# Patient Record
Sex: Female | Born: 1980 | Race: Black or African American | Hispanic: No | Marital: Married | Smoking: Never smoker
Health system: Southern US, Community
[De-identification: ages and names within clinical notes are randomized; demographics above are authoritative.]

## PROBLEM LIST (undated history)

## (undated) DIAGNOSIS — O24419 Gestational diabetes mellitus in pregnancy, unspecified control: Secondary | ICD-10-CM

## (undated) DIAGNOSIS — R87629 Unspecified abnormal cytological findings in specimens from vagina: Secondary | ICD-10-CM

## (undated) DIAGNOSIS — Z8619 Personal history of other infectious and parasitic diseases: Secondary | ICD-10-CM

## (undated) DIAGNOSIS — K602 Anal fissure, unspecified: Secondary | ICD-10-CM

## (undated) DIAGNOSIS — O139 Gestational [pregnancy-induced] hypertension without significant proteinuria, unspecified trimester: Secondary | ICD-10-CM

## (undated) DIAGNOSIS — E559 Vitamin D deficiency, unspecified: Secondary | ICD-10-CM

## (undated) HISTORY — DX: Gestational (pregnancy-induced) hypertension without significant proteinuria, unspecified trimester: O13.9

## (undated) HISTORY — DX: Vitamin D deficiency, unspecified: E55.9

## (undated) HISTORY — DX: Personal history of other infectious and parasitic diseases: Z86.19

## (undated) HISTORY — DX: Anal fissure, unspecified: K60.2

## (undated) HISTORY — DX: Unspecified abnormal cytological findings in specimens from vagina: R87.629

## (undated) HISTORY — PX: NO PAST SURGERIES: SHX2092

## (undated) HISTORY — DX: Gestational diabetes mellitus in pregnancy, unspecified control: O24.419

---

## 2003-01-26 ENCOUNTER — Encounter: Admission: RE | Admit: 2003-01-26 | Discharge: 2003-01-26 | Payer: Self-pay

## 2003-10-16 ENCOUNTER — Emergency Department (HOSPITAL_COMMUNITY): Admission: EM | Admit: 2003-10-16 | Discharge: 2003-10-16 | Payer: Self-pay

## 2005-10-13 ENCOUNTER — Emergency Department (HOSPITAL_COMMUNITY): Admission: EM | Admit: 2005-10-13 | Discharge: 2005-10-13 | Payer: Self-pay | Admitting: Emergency Medicine

## 2014-01-12 LAB — OB RESULTS CONSOLE RPR: RPR: NONREACTIVE

## 2014-01-12 LAB — OB RESULTS CONSOLE GC/CHLAMYDIA
CHLAMYDIA, DNA PROBE: NEGATIVE
Gonorrhea: NEGATIVE

## 2014-01-12 LAB — OB RESULTS CONSOLE ANTIBODY SCREEN: Antibody Screen: NEGATIVE

## 2014-01-12 LAB — OB RESULTS CONSOLE HEPATITIS B SURFACE ANTIGEN: Hepatitis B Surface Ag: NEGATIVE

## 2014-01-12 LAB — OB RESULTS CONSOLE ABO/RH: RH TYPE: POSITIVE

## 2014-01-12 LAB — OB RESULTS CONSOLE HIV ANTIBODY (ROUTINE TESTING): HIV: NONREACTIVE

## 2014-01-12 LAB — OB RESULTS CONSOLE RUBELLA ANTIBODY, IGM: RUBELLA: IMMUNE

## 2014-01-13 NOTE — L&D Delivery Note (Signed)
Operative Delivery Note At 3:01 AM a viable and healthy female was delivered via Vaginal, Vacuum [employer] banker, operational).  Presentation: vertex; Position: Left,, Occiput,, Posterior; Station: +3.  Verbal consent: obtained from patient.  Risks and benefits discussed in detail.  Risks include, but are not limited to the risks of anesthesia, bleeding, infection, damage to maternal tissues, fetal cephalhematoma.  There is also the risk of inability to effect vaginal delivery of the head, or shoulder dystocia that cannot be resolved by established maneuvers, leading to the need for emergency cesarean section. Deep variable decelerations( repetitive). Decision made to assist with delivery. Pedi called APGAR: 8, 9; weight pending  .   Placenta status: Intact, Pathology Spontaneous.   Cord: 3 vessels with the following complications: None.  Cord pH: none  Anesthesia: Local  Instruments: kiwi, mushroom Episiotomy: None Lacerations:  subcitoral laceration Suture Repair: 3.0 chromic Est. Blood Loss (mL): 537  Mom to postpartum.  Baby to Couplet care / Skin to Skin.  Kendra Sparks A 08/29/2014, 4:23 AM

## 2014-06-28 ENCOUNTER — Encounter: Payer: BC Managed Care – PPO | Attending: Obstetrics and Gynecology

## 2014-06-28 VITALS — Ht 61.0 in | Wt 162.8 lb

## 2014-06-28 DIAGNOSIS — Z713 Dietary counseling and surveillance: Secondary | ICD-10-CM | POA: Insufficient documentation

## 2014-06-28 DIAGNOSIS — O24410 Gestational diabetes mellitus in pregnancy, diet controlled: Secondary | ICD-10-CM | POA: Diagnosis not present

## 2014-07-04 NOTE — Progress Notes (Signed)
  Patient was seen on 06/28/14 for Gestational Diabetes self-management class at the [facility]. The following learning objectives were met by the patient during this course:   States the definition of Gestational Diabetes  States why dietary management is important in controlling blood glucose  Describes the effects each nutrient has on blood glucose levels  Demonstrates ability to create a balanced meal plan  Demonstrates carbohydrate counting   States when to check blood glucose levels  Demonstrates proper blood glucose monitoring techniques  States the effect of stress and exercise on blood glucose levels  States the importance of limiting caffeine and abstaining from alcohol and smoking  Blood glucose monitor given: NO, Patient has her own meter Blood glucose reading: 122 mg/dl after meal  Patient instructed to monitor glucose levels: FBS: 60 - <90 2 hour: <120  *Patient received handouts:  Nutrition Diabetes and Pregnancy  Carbohydrate Counting List  Patient will be seen for follow-up as needed.

## 2014-07-21 LAB — US OB FOLLOW UP

## 2014-07-24 ENCOUNTER — Other Ambulatory Visit (HOSPITAL_COMMUNITY): Payer: Self-pay | Admitting: Obstetrics and Gynecology

## 2014-07-24 DIAGNOSIS — O24912 Unspecified diabetes mellitus in pregnancy, second trimester: Secondary | ICD-10-CM

## 2014-07-24 DIAGNOSIS — IMO0002 Reserved for concepts with insufficient information to code with codable children: Secondary | ICD-10-CM

## 2014-07-24 DIAGNOSIS — R9389 Abnormal findings on diagnostic imaging of other specified body structures: Secondary | ICD-10-CM

## 2014-07-25 ENCOUNTER — Ambulatory Visit (HOSPITAL_COMMUNITY)
Admission: RE | Admit: 2014-07-25 | Discharge: 2014-07-25 | Disposition: A | Discharge status: Home / Self Care | Payer: BC Managed Care – PPO | Source: Ambulatory Visit | Attending: Obstetrics and Gynecology | Admitting: Obstetrics and Gynecology

## 2014-07-25 ENCOUNTER — Encounter (HOSPITAL_COMMUNITY): Payer: Self-pay

## 2014-07-25 DIAGNOSIS — O365930 Maternal care for other known or suspected poor fetal growth, third trimester, not applicable or unspecified: Secondary | ICD-10-CM | POA: Insufficient documentation

## 2014-07-25 DIAGNOSIS — Z3A33 33 weeks gestation of pregnancy: Secondary | ICD-10-CM | POA: Insufficient documentation

## 2014-07-25 DIAGNOSIS — O24912 Unspecified diabetes mellitus in pregnancy, second trimester: Secondary | ICD-10-CM | POA: Diagnosis not present

## 2014-07-25 DIAGNOSIS — O283 Abnormal ultrasonic finding on antenatal screening of mother: Secondary | ICD-10-CM | POA: Insufficient documentation

## 2014-07-25 DIAGNOSIS — IMO0002 Reserved for concepts with insufficient information to code with codable children: Secondary | ICD-10-CM

## 2014-07-25 DIAGNOSIS — O365920 Maternal care for other known or suspected poor fetal growth, second trimester, not applicable or unspecified: Secondary | ICD-10-CM | POA: Diagnosis not present

## 2014-07-25 DIAGNOSIS — Z3A00 Weeks of gestation of pregnancy not specified: Secondary | ICD-10-CM | POA: Insufficient documentation

## 2014-07-25 DIAGNOSIS — R9389 Abnormal findings on diagnostic imaging of other specified body structures: Secondary | ICD-10-CM

## 2014-07-25 DIAGNOSIS — Z3689 Encounter for other specified antenatal screening: Secondary | ICD-10-CM | POA: Insufficient documentation

## 2014-07-25 HISTORY — DX: Gestational diabetes mellitus in pregnancy, unspecified control: O24.419

## 2014-07-26 ENCOUNTER — Encounter (HOSPITAL_COMMUNITY): Payer: Self-pay | Admitting: Obstetrics and Gynecology

## 2014-07-26 ENCOUNTER — Other Ambulatory Visit (HOSPITAL_COMMUNITY): Payer: Self-pay | Admitting: Obstetrics and Gynecology

## 2014-08-07 LAB — OB RESULTS CONSOLE GBS: STREP GROUP B AG: POSITIVE

## 2014-08-25 ENCOUNTER — Telehealth (HOSPITAL_COMMUNITY): Payer: Self-pay | Admitting: *Deleted

## 2014-08-25 ENCOUNTER — Encounter (HOSPITAL_COMMUNITY): Payer: Self-pay | Admitting: *Deleted

## 2014-08-25 NOTE — Telephone Encounter (Signed)
Preadmission screen  

## 2014-08-28 ENCOUNTER — Inpatient Hospital Stay (HOSPITAL_COMMUNITY)
Admission: AD | Admit: 2014-08-28 | Discharge: 2014-08-31 | DRG: 774 | Disposition: A | Discharge status: Home / Self Care | Payer: BC Managed Care – PPO | Source: Ambulatory Visit | Attending: Obstetrics and Gynecology | Admitting: Obstetrics and Gynecology

## 2014-08-28 ENCOUNTER — Encounter (HOSPITAL_COMMUNITY): Payer: Self-pay | Admitting: *Deleted

## 2014-08-28 DIAGNOSIS — O24410 Gestational diabetes mellitus in pregnancy, diet controlled: Secondary | ICD-10-CM | POA: Diagnosis present

## 2014-08-28 DIAGNOSIS — O365930 Maternal care for other known or suspected poor fetal growth, third trimester, not applicable or unspecified: Principal | ICD-10-CM | POA: Diagnosis present

## 2014-08-28 DIAGNOSIS — Z833 Family history of diabetes mellitus: Secondary | ICD-10-CM | POA: Diagnosis not present

## 2014-08-28 DIAGNOSIS — O1424 HELLP syndrome, complicating childbirth: Secondary | ICD-10-CM | POA: Diagnosis present

## 2014-08-28 DIAGNOSIS — Z3A38 38 weeks gestation of pregnancy: Secondary | ICD-10-CM | POA: Diagnosis present

## 2014-08-28 DIAGNOSIS — O99824 Streptococcus B carrier state complicating childbirth: Secondary | ICD-10-CM | POA: Diagnosis present

## 2014-08-28 DIAGNOSIS — O1423 HELLP syndrome (HELLP), third trimester: Secondary | ICD-10-CM | POA: Diagnosis present

## 2014-08-28 LAB — URIC ACID: URIC ACID, SERUM: 7.7 mg/dL — AB (ref 2.3–6.6)

## 2014-08-28 LAB — CBC
HCT: 40.9 % (ref 36.0–46.0)
Hemoglobin: 13.9 g/dL (ref 12.0–15.0)
MCH: 30.7 pg (ref 26.0–34.0)
MCHC: 34 g/dL (ref 30.0–36.0)
MCV: 90.3 fL (ref 78.0–100.0)
PLATELETS: 89 10*3/uL — AB (ref 150–400)
RBC: 4.53 MIL/uL (ref 3.87–5.11)
RDW: 14 % (ref 11.5–15.5)
WBC: 9.9 10*3/uL (ref 4.0–10.5)

## 2014-08-28 LAB — APTT: APTT: 34 s (ref 24–37)

## 2014-08-28 LAB — COMPREHENSIVE METABOLIC PANEL
ALT: 471 U/L — ABNORMAL HIGH (ref 14–54)
AST: 198 U/L — AB (ref 15–41)
Albumin: 2.8 g/dL — ABNORMAL LOW (ref 3.5–5.0)
Alkaline Phosphatase: 306 U/L — ABNORMAL HIGH (ref 38–126)
Anion gap: 8 (ref 5–15)
BUN: 8 mg/dL (ref 6–20)
CHLORIDE: 109 mmol/L (ref 101–111)
CO2: 20 mmol/L — AB (ref 22–32)
Calcium: 10.6 mg/dL — ABNORMAL HIGH (ref 8.9–10.3)
Creatinine, Ser: 1.34 mg/dL — ABNORMAL HIGH (ref 0.44–1.00)
GFR, EST NON AFRICAN AMERICAN: 52 mL/min — AB (ref >60)
Glucose, Bld: 99 mg/dL (ref 65–99)
POTASSIUM: 3.9 mmol/L (ref 3.5–5.1)
SODIUM: 137 mmol/L (ref 135–145)
Total Bilirubin: 0.9 mg/dL (ref 0.3–1.2)
Total Protein: 6.2 g/dL — ABNORMAL LOW (ref 6.5–8.1)

## 2014-08-28 LAB — TYPE AND SCREEN
ABO/RH(D): O POS
ANTIBODY SCREEN: NEGATIVE

## 2014-08-28 LAB — FIBRINOGEN: FIBRINOGEN: 338 mg/dL (ref 204–475)

## 2014-08-28 LAB — PROTIME-INR
INR: 1.14 (ref 0.00–1.49)
PROTHROMBIN TIME: 14.8 s (ref 11.6–15.2)

## 2014-08-28 LAB — SAVE SMEAR

## 2014-08-28 MED ORDER — MAGNESIUM SULFATE BOLUS VIA INFUSION
4.0000 g | Freq: Once | INTRAVENOUS | Status: AC
  Administered 2014-08-28: 4 g via INTRAVENOUS
  Filled 2014-08-28: qty 500

## 2014-08-28 MED ORDER — OXYTOCIN 10 UNIT/ML IJ SOLN: 10.0000 [IU] | Freq: Once | INTRAMUSCULAR | Status: DC

## 2014-08-28 MED ORDER — PENICILLIN G POTASSIUM 5000000 UNITS IJ SOLR
2.5000 10*6.[IU] | INTRAVENOUS | Status: DC
  Filled 2014-08-28 (×2): qty 2.5

## 2014-08-28 MED ORDER — SODIUM CHLORIDE 0.9 % IJ SOLN: 9.0000 mL | INTRAMUSCULAR | Status: DC | PRN

## 2014-08-28 MED ORDER — OXYTOCIN BOLUS FROM INFUSION
500.0000 mL | INTRAVENOUS | Status: DC
  Administered 2014-08-29: 500 mL via INTRAVENOUS

## 2014-08-28 MED ORDER — PENICILLIN G POTASSIUM 5000000 UNITS IJ SOLR
5.0000 10*6.[IU] | Freq: Once | INTRAVENOUS | Status: DC
  Filled 2014-08-28: qty 5

## 2014-08-28 MED ORDER — LACTATED RINGERS IV SOLN
INTRAVENOUS | Status: DC
  Administered 2014-08-28: 20:00:00 via INTRAVENOUS

## 2014-08-28 MED ORDER — FENTANYL 2.5 MCG/ML BUPIVACAINE 1/10 % EPIDURAL INFUSION (WH - ANES): 14.0000 mL/h | INTRAMUSCULAR | Status: DC | PRN

## 2014-08-28 MED ORDER — ACETAMINOPHEN 325 MG PO TABS: 650.0000 mg | ORAL_TABLET | ORAL | Status: DC | PRN

## 2014-08-28 MED ORDER — LACTATED RINGERS IV SOLN: 500.0000 mL | INTRAVENOUS | Status: DC | PRN

## 2014-08-28 MED ORDER — OXYCODONE-ACETAMINOPHEN 5-325 MG PO TABS: 2.0000 | ORAL_TABLET | ORAL | Status: DC | PRN

## 2014-08-28 MED ORDER — ONDANSETRON HCL 4 MG/2ML IJ SOLN
4.0000 mg | Freq: Four times a day (QID) | INTRAMUSCULAR | Status: DC | PRN
  Filled 2014-08-28: qty 2

## 2014-08-28 MED ORDER — EPHEDRINE 5 MG/ML INJ
10.0000 mg | INTRAVENOUS | Status: DC | PRN
  Filled 2014-08-28: qty 2

## 2014-08-28 MED ORDER — FLEET ENEMA 7-19 GM/118ML RE ENEM: 1.0000 | ENEMA | RECTAL | Status: DC | PRN

## 2014-08-28 MED ORDER — OXYTOCIN 40 UNITS IN LACTATED RINGERS INFUSION - SIMPLE MED: 62.5000 mL/h | INTRAVENOUS | Status: DC

## 2014-08-28 MED ORDER — PHENYLEPHRINE 40 MCG/ML (10ML) SYRINGE FOR IV PUSH (FOR BLOOD PRESSURE SUPPORT)
80.0000 ug | PREFILLED_SYRINGE | INTRAVENOUS | Status: DC | PRN
  Filled 2014-08-28: qty 2

## 2014-08-28 MED ORDER — PENICILLIN G POTASSIUM 5000000 UNITS IJ SOLR
5.0000 10*6.[IU] | Freq: Once | INTRAVENOUS | Status: AC
  Administered 2014-08-28: 5 10*6.[IU] via INTRAVENOUS
  Filled 2014-08-28: qty 5

## 2014-08-28 MED ORDER — OXYCODONE-ACETAMINOPHEN 5-325 MG PO TABS: 1.0000 | ORAL_TABLET | ORAL | Status: DC | PRN

## 2014-08-28 MED ORDER — OXYTOCIN 40 UNITS IN LACTATED RINGERS INFUSION - SIMPLE MED
1.0000 m[IU]/min | INTRAVENOUS | Status: DC
  Administered 2014-08-28: 2 m[IU]/min via INTRAVENOUS
  Administered 2014-08-29: 6 m[IU]/min via INTRAVENOUS
  Filled 2014-08-28: qty 1000

## 2014-08-28 MED ORDER — LIDOCAINE HCL (PF) 1 % IJ SOLN: 30.0000 mL | INTRAMUSCULAR | Status: DC | PRN

## 2014-08-28 MED ORDER — PHENYLEPHRINE 40 MCG/ML (10ML) SYRINGE FOR IV PUSH (FOR BLOOD PRESSURE SUPPORT)
PREFILLED_SYRINGE | INTRAVENOUS | Status: AC
  Filled 2014-08-28: qty 20

## 2014-08-28 MED ORDER — OXYCODONE-ACETAMINOPHEN 5-325 MG PO TABS
2.0000 | ORAL_TABLET | ORAL | Status: DC | PRN
Start: 2014-08-28 — End: 2014-08-28

## 2014-08-28 MED ORDER — LACTATED RINGERS IV SOLN
INTRAVENOUS | Status: DC
  Administered 2014-08-28 (×2): via INTRAVENOUS

## 2014-08-28 MED ORDER — MAGNESIUM SULFATE 50 % IJ SOLN
1.0000 g/h | INTRAMUSCULAR | Status: DC
  Administered 2014-08-28: 2 g/h via INTRAVENOUS
  Administered 2014-08-30: 1 g/h via INTRAVENOUS
  Filled 2014-08-28 (×2): qty 80

## 2014-08-28 MED ORDER — DIPHENHYDRAMINE HCL 12.5 MG/5ML PO ELIX
12.5000 mg | ORAL_SOLUTION | Freq: Four times a day (QID) | ORAL | Status: DC | PRN
  Filled 2014-08-28: qty 5

## 2014-08-28 MED ORDER — ONDANSETRON HCL 4 MG/2ML IJ SOLN
4.0000 mg | Freq: Four times a day (QID) | INTRAMUSCULAR | Status: DC | PRN
  Administered 2014-08-29: 4 mg via INTRAVENOUS

## 2014-08-28 MED ORDER — DIPHENHYDRAMINE HCL 50 MG/ML IJ SOLN: 12.5000 mg | INTRAMUSCULAR | Status: DC | PRN

## 2014-08-28 MED ORDER — OXYTOCIN BOLUS FROM INFUSION
500.0000 mL | INTRAVENOUS | Status: DC
Start: 2014-08-28 — End: 2014-08-28

## 2014-08-28 MED ORDER — CITRIC ACID-SODIUM CITRATE 334-500 MG/5ML PO SOLN: 30.0000 mL | ORAL | Status: DC | PRN

## 2014-08-28 MED ORDER — LIDOCAINE HCL (PF) 1 % IJ SOLN
30.0000 mL | INTRAMUSCULAR | Status: AC | PRN
  Administered 2014-08-29: 30 mL via SUBCUTANEOUS
  Filled 2014-08-28: qty 30

## 2014-08-28 MED ORDER — FENTANYL 2.5 MCG/ML BUPIVACAINE 1/10 % EPIDURAL INFUSION (WH - ANES)
INTRAMUSCULAR | Status: AC
  Filled 2014-08-28: qty 125

## 2014-08-28 MED ORDER — PENICILLIN G POTASSIUM 5000000 UNITS IJ SOLR
2.5000 10*6.[IU] | INTRAVENOUS | Status: DC
  Administered 2014-08-28: 2.5 10*6.[IU] via INTRAVENOUS
  Filled 2014-08-28 (×5): qty 2.5

## 2014-08-28 MED ORDER — CITRIC ACID-SODIUM CITRATE 334-500 MG/5ML PO SOLN
30.0000 mL | ORAL | Status: DC | PRN
  Filled 2014-08-28: qty 30

## 2014-08-28 MED ORDER — DIPHENHYDRAMINE HCL 50 MG/ML IJ SOLN: 12.5000 mg | Freq: Four times a day (QID) | INTRAMUSCULAR | Status: DC | PRN

## 2014-08-28 MED ORDER — FENTANYL 10 MCG/ML IV SOLN
INTRAVENOUS | Status: DC
  Administered 2014-08-28 – 2014-08-29 (×2): via INTRAVENOUS
  Filled 2014-08-28 (×2): qty 50

## 2014-08-28 MED ORDER — NALOXONE HCL 0.4 MG/ML IJ SOLN
0.4000 mg | INTRAMUSCULAR | Status: DC | PRN
  Filled 2014-08-28: qty 1

## 2014-08-28 MED ORDER — ONDANSETRON HCL 4 MG/2ML IJ SOLN: 4.0000 mg | Freq: Four times a day (QID) | INTRAMUSCULAR | Status: DC | PRN

## 2014-08-28 NOTE — Progress Notes (Signed)
Called regarding FHR deceleration down to 90"s  for 7 mins  With good variability from  baseline 140. Ctx q 2-3 mins subsequent tracing showed return to baseline with accels to 160 VE: 4/90/-2 unchanged from RN exam during the deceleration Ctx now  q 2- 4 mins Received one dose of PCN plt 89K ( nl plt during preg)  IMP: thrombocytopenia r/o HELLP, DIC, gestational GBS cx (+) on IV PCN Early labor ClassA1 GDM P) await labs( added CMP, uric acid, DIC panel). AROM after 2nd dose of IV PCN. Pitocin augmentation prn Fentanyl PCA for pain mgmt. BS q 4 hrs

## 2014-08-28 NOTE — MAU Note (Signed)
Pt states that she began to have regular contractions this afternoon at 1500. Pt denies LOF and states that she is having some spotting with wiping when in BR and baby is active.

## 2014-08-28 NOTE — MAU Note (Signed)
Admit patient with routine orders and GBS protocol orders.

## 2014-08-28 NOTE — H&P (Signed)
Kendra Sparks is a 34 y.o. female presenting @ 38 4/7 weeks  In active labor. Intact membrane. GBS cx (+) (+) vaginal bleeding noted today  Maternal Medical History:  Reason for admission: Contractions and vaginal bleeding.   Contractions: Onset was 6-12 hours ago.   Frequency: regular.   Perceived severity is moderate.    Fetal activity: Perceived fetal activity is normal.    Prenatal complications: SGA Class A1 GDM  Prenatal Complications - Diabetes: gestational. Diabetes is managed by diet.      OB History    Gravida Para Term Preterm AB TAB SAB Ectopic Multiple Living   1              Past Medical History  Diagnosis Date  . GDM (gestational diabetes mellitus)   . Vaginal Pap smear, abnormal   . Hx of condyloma acuminatum   . Vitamin D deficiency   . Hx of varicella   . Anal fissure   . Gestational diabetes     diet controlled   Past Surgical History  Procedure Laterality Date  . No past surgeries     Family History: family history includes Cancer in her mother; Diabetes in her maternal grandmother; Heart attack in her maternal grandfather; Hypertension in her maternal grandmother and mother. Social History:  reports that she has never smoked. She has never used smokeless tobacco. She reports that she does not drink alcohol or use illicit drugs.   Prenatal Transfer Tool  Maternal Diabetes: Yes:  Diabetes Type:  Diet controlled Genetic Screening: Normal Maternal Ultrasounds/Referrals: Abnormal:  Findings:   Other:SGA Fetal Ultrasounds or other Referrals:  Referred to Materal Fetal Medicine  Maternal Substance Abuse:  No Significant Maternal Medications:  Meds include: Other:  Significant Maternal Lab Results:  Lab values include: Group B Strep positive Other Comments:  seen by MFM thought constitutionally small  Review of Systems  All other systems reviewed and are negative.    Exam Physical Exam  Constitutional: She is oriented to person, place,  and time. She appears well-developed and well-nourished.  Eyes: EOM are normal.  Neck: Neck supple.  Cardiovascular: Regular rhythm.   Respiratory: Effort normal.  GI: Soft.  Musculoskeletal: She exhibits no edema.  Neurological: She is alert and oriented to person, place, and time.  Skin: Skin is warm and dry.  Psychiatric: She has a normal mood and affect.   VE per RN 3/90/-2 heavy bloody show  Prenatal labs: ABO, Rh: O/Positive/-- (12/31 0000) Antibody: Negative (12/31 0000) Rubella: Immune (12/31 0000) RPR: Nonreactive (12/31 0000)  HBsAg: Negative (12/31 0000)  HIV: Non-reactive (12/31 0000)  GBS: Positive (07/25 0000)   Assessment/Plan: Early labor Class a1 GDM GBS cx (+) P) admit  Routine labs. IV PCN. Pitocin augmentation. AROm prn. BS q 4 hrs  Morse Brueggemann A

## 2014-08-28 NOTE — Progress Notes (Signed)
Labs c/w HELLP syndrome CMP Latest Ref Rng 08/28/2014  Glucose 65 - 99 mg/dL 99  BUN 6 - 20 mg/dL 8  Creatinine 2.95 - 6.21 mg/dL 3.08(M)  Sodium 578 - 469 mmol/L 137  Potassium 3.5 - 5.1 mmol/L 3.9  Chloride 101 - 111 mmol/L 109  CO2 22 - 32 mmol/L 20(L)  Calcium 8.9 - 10.3 mg/dL 10.6(H)  Total Protein 6.5 - 8.1 g/dL 6.2(L)  Total Bilirubin 0.3 - 1.2 mg/dL 0.9  Alkaline Phos 38 - 126 U/L 306(H)  AST 15 - 41 U/L 198(H)  ALT 14 - 54 U/L 471(H)   BP 134/57 DIC panel: fibrinogen 338. PT 14  P) start magnesium for seizure prophylaxis. Start Pitocin now. Amniotomy as planned. Pt and husband informed

## 2014-08-29 ENCOUNTER — Encounter (HOSPITAL_COMMUNITY): Payer: Self-pay | Admitting: *Deleted

## 2014-08-29 DIAGNOSIS — O24410 Gestational diabetes mellitus in pregnancy, diet controlled: Secondary | ICD-10-CM | POA: Diagnosis present

## 2014-08-29 DIAGNOSIS — O1424 HELLP syndrome, complicating childbirth: Secondary | ICD-10-CM | POA: Diagnosis present

## 2014-08-29 LAB — MAGNESIUM
MAGNESIUM: 5 mg/dL — AB (ref 1.7–2.4)
Magnesium: 5.4 mg/dL — ABNORMAL HIGH (ref 1.7–2.4)

## 2014-08-29 LAB — COMPREHENSIVE METABOLIC PANEL
ALBUMIN: 2.2 g/dL — AB (ref 3.5–5.0)
ALK PHOS: 233 U/L — AB (ref 38–126)
ALT: 416 U/L — ABNORMAL HIGH (ref 14–54)
ALT: 302 U/L — AB (ref 14–54)
ANION GAP: 14 (ref 5–15)
AST: 170 U/L — ABNORMAL HIGH (ref 15–41)
AST: 111 U/L — AB (ref 15–41)
Albumin: 2.4 g/dL — ABNORMAL LOW (ref 3.5–5.0)
Alkaline Phosphatase: 308 U/L — ABNORMAL HIGH (ref 38–126)
Anion gap: 9 (ref 5–15)
BILIRUBIN TOTAL: 0.9 mg/dL (ref 0.3–1.2)
BILIRUBIN TOTAL: 0.9 mg/dL (ref 0.3–1.2)
BUN: 9 mg/dL (ref 6–20)
BUN: 10 mg/dL (ref 6–20)
CO2: 15 mmol/L — ABNORMAL LOW (ref 22–32)
CO2: 20 mmol/L — ABNORMAL LOW (ref 22–32)
CREATININE: 1.31 mg/dL — AB (ref 0.44–1.00)
Calcium: 10.2 mg/dL (ref 8.9–10.3)
Calcium: 9.1 mg/dL (ref 8.9–10.3)
Chloride: 106 mmol/L (ref 101–111)
Chloride: 102 mmol/L (ref 101–111)
Creatinine, Ser: 1.45 mg/dL — ABNORMAL HIGH (ref 0.44–1.00)
GFR calc Af Amer: >60 mL/min (ref >60)
GFR, EST AFRICAN AMERICAN: 55 mL/min — AB (ref >60)
GFR, EST NON AFRICAN AMERICAN: 47 mL/min — AB (ref >60)
GFR, EST NON AFRICAN AMERICAN: 54 mL/min — AB (ref >60)
GLUCOSE: 110 mg/dL — AB (ref 65–99)
Glucose, Bld: 101 mg/dL — ABNORMAL HIGH (ref 65–99)
POTASSIUM: 4.6 mmol/L (ref 3.5–5.1)
POTASSIUM: 3.9 mmol/L (ref 3.5–5.1)
Sodium: 135 mmol/L (ref 135–145)
Sodium: 131 mmol/L — ABNORMAL LOW (ref 135–145)
TOTAL PROTEIN: 5.5 g/dL — AB (ref 6.5–8.1)
TOTAL PROTEIN: 5 g/dL — AB (ref 6.5–8.1)

## 2014-08-29 LAB — CBC
HEMATOCRIT: 36.3 % (ref 36.0–46.0)
HEMATOCRIT: 28.5 % — AB (ref 36.0–46.0)
HEMOGLOBIN: 9.8 g/dL — AB (ref 12.0–15.0)
Hemoglobin: 12.5 g/dL (ref 12.0–15.0)
MCH: 30.8 pg (ref 26.0–34.0)
MCH: 30.4 pg (ref 26.0–34.0)
MCHC: 34.4 g/dL (ref 30.0–36.0)
MCHC: 34.4 g/dL (ref 30.0–36.0)
MCV: 89.4 fL (ref 78.0–100.0)
MCV: 88.5 fL (ref 78.0–100.0)
Platelets: 102 10*3/uL — ABNORMAL LOW (ref 150–400)
Platelets: 104 10*3/uL — ABNORMAL LOW (ref 150–400)
RBC: 4.06 MIL/uL (ref 3.87–5.11)
RBC: 3.22 MIL/uL — AB (ref 3.87–5.11)
RDW: 13.9 % (ref 11.5–15.5)
RDW: 13.9 % (ref 11.5–15.5)
WBC: 14.4 10*3/uL — ABNORMAL HIGH (ref 4.0–10.5)
WBC: 22.1 10*3/uL — AB (ref 4.0–10.5)

## 2014-08-29 LAB — RPR: RPR Ser Ql: NONREACTIVE

## 2014-08-29 LAB — GLUCOSE, CAPILLARY
GLUCOSE-CAPILLARY: 101 mg/dL — AB (ref 65–99)
Glucose-Capillary: 90 mg/dL (ref 65–99)

## 2014-08-29 LAB — URIC ACID: Uric Acid, Serum: 8.7 mg/dL — ABNORMAL HIGH (ref 2.3–6.6)

## 2014-08-29 LAB — MRSA PCR SCREENING: MRSA by PCR: NEGATIVE

## 2014-08-29 LAB — ABO/RH: ABO/RH(D): O POS

## 2014-08-29 MED ORDER — SENNOSIDES-DOCUSATE SODIUM 8.6-50 MG PO TABS
2.0000 | ORAL_TABLET | ORAL | Status: DC
  Administered 2014-08-29 – 2014-08-31 (×3): 2 via ORAL
  Filled 2014-08-29 (×4): qty 2

## 2014-08-29 MED ORDER — WITCH HAZEL-GLYCERIN EX PADS: 1.0000 "application " | MEDICATED_PAD | CUTANEOUS | Status: DC | PRN

## 2014-08-29 MED ORDER — IBUPROFEN 600 MG PO TABS
600.0000 mg | ORAL_TABLET | Freq: Four times a day (QID) | ORAL | Status: DC
  Filled 2014-08-29: qty 1

## 2014-08-29 MED ORDER — PRENATAL MULTIVITAMIN CH
1.0000 | ORAL_TABLET | Freq: Every day | ORAL | Status: DC
  Administered 2014-08-29 – 2014-08-31 (×3): 1 via ORAL
  Filled 2014-08-29 (×3): qty 1

## 2014-08-29 MED ORDER — DIPHENHYDRAMINE HCL 25 MG PO CAPS: 25.0000 mg | ORAL_CAPSULE | Freq: Four times a day (QID) | ORAL | Status: DC | PRN

## 2014-08-29 MED ORDER — OXYCODONE-ACETAMINOPHEN 5-325 MG PO TABS: 1.0000 | ORAL_TABLET | ORAL | Status: DC | PRN

## 2014-08-29 MED ORDER — FERROUS SULFATE 325 (65 FE) MG PO TABS
325.0000 mg | ORAL_TABLET | Freq: Two times a day (BID) | ORAL | Status: DC
  Administered 2014-08-29 – 2014-08-31 (×6): 325 mg via ORAL
  Filled 2014-08-29 (×6): qty 1

## 2014-08-29 MED ORDER — ACETAMINOPHEN 325 MG PO TABS
650.0000 mg | ORAL_TABLET | ORAL | Status: DC | PRN
  Administered 2014-08-30 (×2): 650 mg via ORAL
  Filled 2014-08-29 (×2): qty 2

## 2014-08-29 MED ORDER — ONDANSETRON HCL 4 MG/2ML IJ SOLN: 4.0000 mg | INTRAMUSCULAR | Status: DC | PRN

## 2014-08-29 MED ORDER — LACTATED RINGERS IV SOLN: INTRAVENOUS | Status: DC

## 2014-08-29 MED ORDER — BENZOCAINE-MENTHOL 20-0.5 % EX AERO
1.0000 "application " | INHALATION_SPRAY | CUTANEOUS | Status: DC | PRN
  Filled 2014-08-29: qty 56

## 2014-08-29 MED ORDER — DIBUCAINE 1 % RE OINT: 1.0000 "application " | TOPICAL_OINTMENT | RECTAL | Status: DC | PRN

## 2014-08-29 MED ORDER — LACTATED RINGERS IV SOLN
INTRAVENOUS | Status: DC
  Administered 2014-08-29: 18:00:00 via INTRAVENOUS

## 2014-08-29 MED ORDER — LANOLIN HYDROUS EX OINT: TOPICAL_OINTMENT | CUTANEOUS | Status: DC | PRN

## 2014-08-29 MED ORDER — ZOLPIDEM TARTRATE 5 MG PO TABS: 5.0000 mg | ORAL_TABLET | Freq: Every evening | ORAL | Status: DC | PRN

## 2014-08-29 MED ORDER — LACTATED RINGERS IV BOLUS (SEPSIS)
1000.0000 mL | Freq: Once | INTRAVENOUS | Status: AC
  Administered 2014-08-29: 1000 mL via INTRAVENOUS

## 2014-08-29 MED ORDER — ONDANSETRON HCL 4 MG PO TABS: 4.0000 mg | ORAL_TABLET | ORAL | Status: DC | PRN

## 2014-08-29 MED ORDER — PANTOPRAZOLE SODIUM 40 MG PO TBEC
40.0000 mg | DELAYED_RELEASE_TABLET | Freq: Every day | ORAL | Status: DC
  Administered 2014-08-29 – 2014-08-31 (×3): 40 mg via ORAL
  Filled 2014-08-29 (×3): qty 1

## 2014-08-29 MED ORDER — OXYCODONE-ACETAMINOPHEN 5-325 MG PO TABS: 2.0000 | ORAL_TABLET | ORAL | Status: DC | PRN

## 2014-08-29 MED ORDER — SIMETHICONE 80 MG PO CHEW: 80.0000 mg | CHEWABLE_TABLET | ORAL | Status: DC | PRN

## 2014-08-29 NOTE — Progress Notes (Signed)
S: called for rapid dilation and poss delivery  O: Pitocin 8 miu VE fully (+) 2 station  Tracing reviewed: deep variable decels with ctx Baseline 115 good variabilty Ctx q 2 mins  ImP: complete with variable decels P) pushed for a few ctx( ineffective). To use peanut ball and labor vtx down. Cont with amnioinfusion

## 2014-08-29 NOTE — Progress Notes (Addendum)
Wasted 31ml in sink of fentanyl PCA with E.Diamonds Lippard, RN

## 2014-08-29 NOTE — Progress Notes (Signed)
Patient up to stand at side of bed.  Orthostatic VS (see flowsheet).  Patient's heart rate increased to 130s-140swhile standing; patient denies dizziness, shortness of breath, pain.  Patient returned to bed and heart rate decreased to low 100s and 90s.  Dr. Seymour Bars notified; no new orders at this time.

## 2014-08-29 NOTE — Consult Note (Addendum)
Neonatology Note:   Attendance at Delivery:    I was asked by Dr. Cherly Hensen to attend this vacuum-assisted vaginal delivery at term due to known IUGR, HELLP, and FHR decelerations. The mother is a G1P0 O pos, GBS pos with HELLP, vaginal bleeding, and diet-controlled GDM. She had been followed by MFM and IUGR felt to be constitutional, with very recent onset of HELLP. On Magnesium sulfate for about 4 hours, and on Pen G > 4 hours prior to delivery. ROM 3 hours before delivery, fluid with meconium. Infant vigorous with good spontaneous cry and tone. Needed only minimal bulb suctioning. Ap 8/9. Lungs clear to ausc in DR. Infant appears well over weight requirement for CN. To CN to care of Pediatrician.   Doretha Sou, MD

## 2014-08-29 NOTE — Progress Notes (Signed)
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

## 2014-08-29 NOTE — Lactation Note (Signed)
This note was copied from the chart of Kendra Sparks. Lactation Consultation Note  Initial visit made.  Breastfeeding consultation services and support information given to patient.  Baby is 15 hours old and has been to the breast frequently.  This is mom's first baby.  Basic teaching done and questions answered.  Baby showing feeding cues.  Assisted with latching baby to breast using side lying position.  Baby latched easily and deep and active suckling observed.  Encouraged to call for feeding assist or concerns.  Patient Name: Kendra Genevie Elman [id number] Date: 08/29/2014 Reason for consult: Initial assessment   Maternal Data    Feeding Feeding Type: Breast Fed Length of feed: 10 min  LATCH Score/Interventions Latch: Grasps breast easily, tongue down, lips flanged, rhythmical sucking. Intervention(s): Adjust position;Assist with latch;Breast massage;Breast compression  Audible Swallowing: A few with stimulation Intervention(s): Skin to skin;Hand expression;Alternate breast massage  Type of Nipple: Everted at rest and after stimulation  Comfort (Breast/Nipple): Soft / non-tender     Hold (Positioning): Assistance needed to correctly position infant at breast and maintain latch. Intervention(s): Breastfeeding basics reviewed;Support Pillows;Position options;Skin to skin  LATCH Score: 8  Lactation Tools Discussed/Used     Consult Status      Huston Foley 08/29/2014, 5:00 PM

## 2014-08-29 NOTE — Progress Notes (Signed)
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 Repeat CBC, CMP with mag level @ 3 am. IV PCN

## 2014-08-29 NOTE — Progress Notes (Signed)
UR chart review completed.  

## 2014-08-30 LAB — COMPREHENSIVE METABOLIC PANEL
ALK PHOS: 207 U/L — AB (ref 38–126)
ALT: 193 U/L — AB (ref 14–54)
ANION GAP: 5 (ref 5–15)
AST: 50 U/L — ABNORMAL HIGH (ref 15–41)
Albumin: 2.1 g/dL — ABNORMAL LOW (ref 3.5–5.0)
BUN: 10 mg/dL (ref 6–20)
CALCIUM: 7.7 mg/dL — AB (ref 8.9–10.3)
CO2: 25 mmol/L (ref 22–32)
CREATININE: 1.25 mg/dL — AB (ref 0.44–1.00)
Chloride: 107 mmol/L (ref 101–111)
GFR, EST NON AFRICAN AMERICAN: 57 mL/min — AB (ref >60)
Glucose, Bld: 93 mg/dL (ref 65–99)
Potassium: 3.7 mmol/L (ref 3.5–5.1)
Sodium: 137 mmol/L (ref 135–145)
Total Bilirubin: 0.7 mg/dL (ref 0.3–1.2)
Total Protein: 4.9 g/dL — ABNORMAL LOW (ref 6.5–8.1)

## 2014-08-30 LAB — CBC
HCT: 25.6 % — ABNORMAL LOW (ref 36.0–46.0)
Hemoglobin: 8.7 g/dL — ABNORMAL LOW (ref 12.0–15.0)
MCH: 30.6 pg (ref 26.0–34.0)
MCHC: 34 g/dL (ref 30.0–36.0)
MCV: 90.1 fL (ref 78.0–100.0)
PLATELETS: 101 10*3/uL — AB (ref 150–400)
RBC: 2.84 MIL/uL — AB (ref 3.87–5.11)
RDW: 14.3 % (ref 11.5–15.5)
WBC: 17.2 10*3/uL — ABNORMAL HIGH (ref 4.0–10.5)

## 2014-08-30 LAB — URIC ACID: URIC ACID, SERUM: 8.1 mg/dL — AB (ref 2.3–6.6)

## 2014-08-30 MED ORDER — SODIUM CHLORIDE 0.9 % IJ SOLN
3.0000 mL | Freq: Two times a day (BID) | INTRAMUSCULAR | Status: DC
  Administered 2014-08-30 (×2): 3 mL via INTRAVENOUS

## 2014-08-30 MED ORDER — SODIUM CHLORIDE 0.9 % IJ SOLN: 3.0000 mL | INTRAMUSCULAR | Status: DC | PRN

## 2014-08-30 MED ORDER — SODIUM CHLORIDE 0.9 % IV SOLN: 250.0000 mL | INTRAVENOUS | Status: DC | PRN

## 2014-08-30 MED ORDER — MAGNESIUM OXIDE 400 (241.3 MG) MG PO TABS
200.0000 mg | ORAL_TABLET | Freq: Every day | ORAL | Status: DC
  Administered 2014-08-31: 200 mg via ORAL
  Filled 2014-08-30 (×2): qty 0.5

## 2014-08-30 NOTE — Progress Notes (Signed)
Pt transferred ambulatory to [facility] Rm #[id number].

## 2014-08-30 NOTE — Progress Notes (Signed)
PPD # 1/AICU Day #1  Subjective: Pt reports feeling well/ Pain controlled with ice pks and dermaplast Tolerating po/ Foley in place No HA, visual disturbances, or epigastric pain Activity: up with assistance Bleeding is light Newborn info:  Information for the patient's newborn:  Jaquanna, Ballentine [[id number]]  female Feeding: breast   Objective:  VS:  Filed Vitals:   08/30/14 0839 08/30/14 0900 08/30/14 0935 08/30/14 1000  BP:  123/63  108/52  Pulse: 102     Temp:      TempSrc:      Resp:   18   Height:      Weight:      SpO2: 98%        I&O: Intake/Output      08/16 0701 - 08/17 0700 08/17 0701 - 08/18 0700   P.O. 1390 600   I.V. (mL/kg) 2867.5 (38.4) 353.5 (4.7)   IV Piggyback     Total Intake(mL/kg) 4257.5 (57) 953.5 (12.8)   Urine (mL/kg/hr) 6900 (3.8) 1650 (5.8)   Emesis/NG output     Blood     Total Output 6900 1650   Net -2642.5 -696.5           Recent Labs  08/29/14 1000 08/30/14 0955  WBC 22.1* 17.2*  HGB 9.8* 8.7*  HCT 28.5* 25.6*  PLT 104* 101*    Blood type: --/--/O POS, O POS (08/15 1950) Rubella: Immune (12/31 0000)    Physical Exam:  General: alert, cooperative and no distress CV: Regular rate and rhythm Resp: CTA bilaterally Abdomen: soft, nontender, normal bowel sounds Perineum: Well approximated, no significant erythema, edema, or drainage; healing well. Uterine Fundus: firm, below umbilicus, nontender Lochia: minimal GU: Foley to SD, clear yellow, abundant Ext: edema 1+ BLE, Homans sign is negative, no sign of DVT and DTRs 1+, no clonus   Assessment: PPD # 1/ G1P1001/ S/P VAVD, periclitoral laceration HELLP A1GDM, delivered ABL anemia  Plan: LE, Ct, and uric acid all trending down, platelets stable, diuresis excellent-continue strict I/O, BP stable, watch closely  Discontinue Mag Sulfate Transfer to MB unit after 4 hrs obs  Consult with Dr. Cherly Hensen on A/P, agrees.   Signed: Lawernce Pitts, MSN,  CNM 08/30/2014, 10:48 AM

## 2014-08-30 NOTE — Lactation Note (Signed)
This note was copied from the chart of Kendra Sparks. Lactation Consultation Note  Patient Name: Kendra Manjot Beumer [id number] Date: 08/30/2014 Reason for consult: Follow-up assessment  NICU baby 70 hours old. Mom reports that she is experiencing a pinching pain while nursing and her nipples are sore. Demonstrated hand expression with small amount of colostrum visible at left breast. Enc mom to use EBM on nipples for healing. Assisted mom to latch baby in football position to left breast. Baby latched deeply, suckling rhythmically with a few swallows noted. Demonstrated to parents how to flange lower lip and enc mom to re-latch baby if he slips to the tip of her nipple. Mom reports increased comfort with nursing and states that this is the most comfortable she has been since the baby was born. Baby's has a tight lingual frenum that caused a bowl shape to the tongue. Discussed with that if sore nipples continue after her milk comes in, she should discuss baby's tongue/frenum with pediatrician. Mom aware of OP/BFSG and LC phone line assistance after D/C. Enc mom to nurse with cues, and enc mom to supplement with formula later this afternoon if baby hungry and her breast milk supply not increasing. Also discussed possibility of NS if baby not able to maintain a deep latch. Mom had reported that baby kept slipping off the breast earlier. Discussed assessment and interventions with patient's RN, Okey Regal.  Maternal Data Has patient been taught Hand Expression?: Yes Does the patient have breastfeeding experience prior to this delivery?: No  Feeding Feeding Type: Breast Fed Length of feed:  (LC assessed first 15 minutes of BF.)  LATCH Score/Interventions Latch: Grasps breast easily, tongue down, lips flanged, rhythmical sucking. Intervention(s): Adjust position;Assist with latch;Breast compression  Audible Swallowing: A few with stimulation Intervention(s): Skin to skin;Hand expression  Type of  Nipple: Everted at rest and after stimulation  Comfort (Breast/Nipple): Filling, red/small blisters or bruises, mild/mod discomfort  Problem noted: Mild/Moderate discomfort  Hold (Positioning): Assistance needed to correctly position infant at breast and maintain latch. Intervention(s): Breastfeeding basics reviewed;Support Pillows;Position options;Skin to skin  LATCH Score: 7  Lactation Tools Discussed/Used     Consult Status Consult Status: Follow-up Date: 08/31/14 Follow-up type: In-patient    Geralynn Ochs 08/30/2014, 11:59 AM

## 2014-08-30 NOTE — Lactation Note (Signed)
This note was copied from the chart of Kendra Keamber Macfadden. Lactation Consultation Note Last void >24 hours and mom is just beginning to pump.  MBU RN reports good latch, but baby is still hungry.  Read previous LC note and suggested small supplements to starts and hand expression with pumping every 3 hours.    Patient Name: Kendra Sparks Date: 08/30/2014     Maternal Data    Feeding Feeding Type: Breast Fed Length of feed: 30 min  LATCH Score/Interventions Latch: Grasps breast easily, tongue down, lips flanged, rhythmical sucking. Intervention(s): Assist with latch;Adjust position  Audible Swallowing: Spontaneous and intermittent  Type of Nipple: Everted at rest and after stimulation  Comfort (Breast/Nipple): Soft / non-tender     Hold (Positioning): Assistance needed to correctly position infant at breast and maintain latch.  LATCH Score: 9  Lactation Tools Discussed/Used     Consult Status      Shoptaw, Arvella Merles 08/30/2014, 5:33 PM

## 2014-08-31 LAB — COMPREHENSIVE METABOLIC PANEL
ALBUMIN: 2.4 g/dL — AB (ref 3.5–5.0)
ALT: 174 U/L — ABNORMAL HIGH (ref 14–54)
AST: 66 U/L — AB (ref 15–41)
Alkaline Phosphatase: 209 U/L — ABNORMAL HIGH (ref 38–126)
Anion gap: 6 (ref 5–15)
BUN: 13 mg/dL (ref 6–20)
CHLORIDE: 107 mmol/L (ref 101–111)
CO2: 27 mmol/L (ref 22–32)
Calcium: 7.8 mg/dL — ABNORMAL LOW (ref 8.9–10.3)
Creatinine, Ser: 1.16 mg/dL — ABNORMAL HIGH (ref 0.44–1.00)
GFR calc Af Amer: >60 mL/min (ref >60)
GFR calc non Af Amer: >60 mL/min (ref >60)
GLUCOSE: 95 mg/dL (ref 65–99)
POTASSIUM: 3.6 mmol/L (ref 3.5–5.1)
SODIUM: 140 mmol/L (ref 135–145)
Total Bilirubin: 0.7 mg/dL (ref 0.3–1.2)
Total Protein: 5.4 g/dL — ABNORMAL LOW (ref 6.5–8.1)

## 2014-08-31 LAB — CBC
HCT: 28.5 % — ABNORMAL LOW (ref 36.0–46.0)
Hemoglobin: 9.6 g/dL — ABNORMAL LOW (ref 12.0–15.0)
MCH: 31.2 pg (ref 26.0–34.0)
MCHC: 33.7 g/dL (ref 30.0–36.0)
MCV: 92.5 fL (ref 78.0–100.0)
PLATELETS: 137 10*3/uL — AB (ref 150–400)
RBC: 3.08 MIL/uL — ABNORMAL LOW (ref 3.87–5.11)
RDW: 15 % (ref 11.5–15.5)
WBC: 16.5 10*3/uL — AB (ref 4.0–10.5)

## 2014-08-31 MED ORDER — FERROUS SULFATE 325 (65 FE) MG PO TABS: 325.0000 mg | ORAL_TABLET | Freq: Every day | ORAL | Status: DC

## 2014-08-31 MED ORDER — IBUPROFEN 600 MG PO TABS: 600.0000 mg | ORAL_TABLET | Freq: Three times a day (TID) | ORAL | Status: DC | PRN

## 2014-08-31 MED ORDER — MAGNESIUM OXIDE 400 (241.3 MG) MG PO TABS: 200.0000 mg | ORAL_TABLET | Freq: Every day | ORAL | Status: DC

## 2014-08-31 MED ORDER — HYDROCHLOROTHIAZIDE 12.5 MG PO CAPS: 12.5000 mg | ORAL_CAPSULE | Freq: Every day | ORAL | Status: DC

## 2014-08-31 NOTE — Progress Notes (Signed)
PPD 2 SVD/ HELLP  S:  Reports feeling well - no PIH symptoms / desires DC today this pm if possible             Tolerating po/ No nausea or vomiting             Bleeding is light             Up ad lib / ambulatory / voiding QS  Newborn breast feeding & supplementing / Circumcision done  O:               VS: BP 124/64 mmHg  Pulse 70  Temp(Src) 98 F (36.7 C) (Oral)  Resp 16  Ht  (1.549 m)  Wt 74.753 kg (164 lb 12.8 oz)  BMI 31.15 kg/m2  SpO2 97%  Breastfeeding? Unknown   LABS:              Recent Labs  08/30/14 0955 08/31/14 0756  WBC 17.2* 16.5*  HGB 8.7* 9.6*  PLT 101* 137*               Blood type: --/--/O POS, O POS (08/15 1950)  Rubella: Immune (12/31 0000)                     I&O: no I& O since transfer - will check outpt today             Physical Exam:             Alert and oriented X3  Lungs: Clear and unlabored  Heart: regular rate and rhythm / no mumurs  Abdomen: soft, non-tender, non-distended              Fundus: firm, non-tender, U-1  Perineum: no edema  Lochia: light  Extremities: 1+ edema, no calf pain or tenderness    A: PPD # 2 HELLP             Assess outpt today for adequate diuresis off magnesium  Doing well - stable status  P: Routine post partum orders  Consider DC this PM if outpt good  Marlinda Mike CNM, MSN, [facility] 08/31/2014, 10:12 AM

## 2014-08-31 NOTE — Lactation Note (Signed)
This note was copied from the chart of Kendra Sparks. Lactation Consultation Note  Mom reports that her nipples are sore and that baby is coming off of the breast hungry.  Glandular tissue is palpable.  Baby's oral cavity is tight and he initially chewed on a gloved finger but as it was advanced he started to suckle better.  Mom was shown how to position him using an asymmetrical hold. Chin tug and lip flanging was also demonstrated.  I recommended she BF first give formula if needed and post pump.  The preemie setting was explained and demonstrated to her,  Patient Name: Kendra Sparks [id number] Date: 08/31/2014 Reason for consult: Follow-up assessment   Maternal Data    Feeding Feeding Type: Breast Fed  LATCH Score/Interventions Latch: Repeated attempts needed to sustain latch, nipple held in mouth throughout feeding, stimulation needed to elicit sucking reflex.  Audible Swallowing: A few with stimulation  Type of Nipple: Everted at rest and after stimulation  Comfort (Breast/Nipple): Soft / non-tender     Hold (Positioning): Assistance needed to correctly position infant at breast and maintain latch.  LATCH Score: 7  Lactation Tools Discussed/Used     Consult Status Consult Status: Follow-up Date: 09/01/14 Follow-up type: In-patient    Soyla Dryer 08/31/2014, 12:06 PM

## 2014-08-31 NOTE — Discharge Summary (Signed)
Obstetric Discharge Summary  Reason for Admission: onset of labor Prenatal Procedures: none Intrapartum Procedures: spontaneous vaginal delivery Postpartum Procedures: Magnesium sulfate prophyalxis Complications-Operative and Postpartum: 2nd degree perineal laceration HEMOGLOBIN  Date Value Ref Range Status  08/31/2014 9.6* 12.0 - 15.0 g/dL Final   HCT  Date Value Ref Range Status  08/31/2014 28.5* 36.0 - 46.0 % Final    Physical Exam:  General: alert, cooperative and no distress Lochia: appropriate Uterine Fundus: firm Incision: healing well DVT Evaluation: No evidence of DVT seen on physical exam.  Discharge Diagnoses: Term Pregnancy-delivered, Preelampsia and ABL anemia  Discharge Information: Date: 08/31/2014 Activity: pelvic rest Diet: routine Medications: PNV, Ibuprofen and Percocet Condition: stable Instructions: refer to practice specific booklet Discharge to: home Follow-up Information    Follow up with Elmira Olkowski A, MD. Schedule an appointment as soon as possible for a visit in 4 days.   Specialty:  Obstetrics and Gynecology   Contact information:   [address] Kendra Sparks [place] [address] [phone]       Newborn Data: Live born female  Birth Weight: 6 lb 9.3 oz (2985 g) APGAR: 8, 9  Home with mother.  Kendra Sparks 08/31/2014, 5:22 PM

## 2014-09-02 ENCOUNTER — Inpatient Hospital Stay (HOSPITAL_COMMUNITY): Admission: RE | Admit: 2014-09-02 | Payer: BC Managed Care – PPO | Source: Ambulatory Visit

## 2016-10-17 ENCOUNTER — Ambulatory Visit (INDEPENDENT_AMBULATORY_CARE_PROVIDER_SITE_OTHER): Payer: BC Managed Care – PPO | Admitting: Family Medicine

## 2016-10-17 ENCOUNTER — Encounter: Payer: Self-pay | Admitting: Family Medicine

## 2016-10-17 VITALS — BP 100/80 | HR 70 | Temp 98.2°F | Ht 61.0 in | Wt 125.7 lb

## 2016-10-17 DIAGNOSIS — E559 Vitamin D deficiency, unspecified: Secondary | ICD-10-CM

## 2016-10-17 DIAGNOSIS — Z13220 Encounter for screening for lipoid disorders: Secondary | ICD-10-CM

## 2016-10-17 DIAGNOSIS — Z23 Encounter for immunization: Secondary | ICD-10-CM

## 2016-10-17 DIAGNOSIS — Z0000 Encounter for general adult medical examination without abnormal findings: Secondary | ICD-10-CM | POA: Diagnosis not present

## 2016-10-17 DIAGNOSIS — R5383 Other fatigue: Secondary | ICD-10-CM

## 2016-10-17 LAB — COMPREHENSIVE METABOLIC PANEL
ALBUMIN: 4.5 g/dL (ref 3.5–5.2)
ALK PHOS: 84 U/L (ref 39–117)
ALT: 8 U/L (ref 0–35)
AST: 12 U/L (ref 0–37)
BUN: 15 mg/dL (ref 6–23)
CALCIUM: 9.8 mg/dL (ref 8.4–10.5)
CHLORIDE: 104 meq/L (ref 96–112)
CO2: 28 mEq/L (ref 19–32)
Creatinine, Ser: 0.7 mg/dL (ref 0.40–1.20)
GFR: 123.33 mL/min (ref >60.00)
Glucose, Bld: 90 mg/dL (ref 70–99)
POTASSIUM: 5.5 meq/L — AB (ref 3.5–5.1)
SODIUM: 139 meq/L (ref 135–145)
TOTAL PROTEIN: 7.6 g/dL (ref 6.0–8.3)
Total Bilirubin: 0.9 mg/dL (ref 0.2–1.2)

## 2016-10-17 LAB — LIPID PANEL
CHOLESTEROL: 177 mg/dL (ref 0–200)
HDL: 75.3 mg/dL (ref >39.00)
LDL Cholesterol: 94 mg/dL (ref 0–99)
NonHDL: 101.92
TRIGLYCERIDES: 38 mg/dL (ref 0.0–149.0)
Total CHOL/HDL Ratio: 2
VLDL: 7.6 mg/dL (ref 0.0–40.0)

## 2016-10-17 LAB — VITAMIN D 25 HYDROXY (VIT D DEFICIENCY, FRACTURES): VITD: 19.01 ng/mL — ABNORMAL LOW (ref 30.00–100.00)

## 2016-10-17 LAB — TSH: TSH: 1.44 u[IU]/mL (ref 0.35–4.50)

## 2016-10-17 LAB — T4, FREE: Free T4: 0.88 ng/dL (ref 0.60–1.60)

## 2016-10-17 NOTE — Patient Instructions (Addendum)
Health Maintenance, Female Adopting a healthy lifestyle and getting preventive care can go a long way to promote health and wellness. Talk with your health care provider about what schedule of regular examinations is right for you. This is a good chance for you to check in with your provider about disease prevention and staying healthy. In between checkups, there are plenty of things you can do on your own. Experts have done a lot of research about which lifestyle changes and preventive measures are most likely to keep you healthy. Ask your health care provider for more information. Weight and diet Eat a healthy diet  Be sure to include plenty of vegetables, fruits, low-fat dairy products, and lean protein.  Do not eat a lot of foods high in solid fats, added sugars, or salt.  Get regular exercise. This is one of the most important things you can do for your health. ? Most adults should exercise for at least 150 minutes each week. The exercise should increase your heart rate and make you sweat (moderate-intensity exercise). ? Most adults should also do strengthening exercises at least twice a week. This is in addition to the moderate-intensity exercise.  Maintain a healthy weight  Body mass index (BMI) is a measurement that can be used to identify possible weight problems. It estimates body fat based on height and weight. Your health care provider can help determine your BMI and help you achieve or maintain a healthy weight.  For females 20 years of age and older: ? A BMI below 18.5 is considered underweight. ? A BMI of 18.5 to 24.9 is normal. ? A BMI of 25 to 29.9 is considered overweight. ? A BMI of 30 and above is considered obese.  Watch levels of cholesterol and blood lipids  You should start having your blood tested for lipids and cholesterol at 34 years of age, then have this test every 5 years.  You may need to have your cholesterol levels checked more often if: ? Your lipid or  cholesterol levels are high. ? You are older than 34 years of age. ? You are at high risk for heart disease.  Cancer screening Lung Cancer  Lung cancer screening is recommended for adults 55-80 years old who are at high risk for lung cancer because of a history of smoking.  A yearly low-dose CT scan of the lungs is recommended for people who: ? Currently smoke. ? Have quit within the past 15 years. ? Have at least a 30-pack-year history of smoking. A pack year is smoking an average of one pack of cigarettes a day for 1 year.  Yearly screening should continue until it has been 15 years since you quit.  Yearly screening should stop if you develop a health problem that would prevent you from having lung cancer treatment.  Breast Cancer  Practice breast self-awareness. This means understanding how your breasts normally appear and feel.  It also means doing regular breast self-exams. Let your health care provider know about any changes, no matter how small.  If you are in your 20s or 30s, you should have a clinical breast exam (CBE) by a health care provider every 1-3 years as part of a regular health exam.  If you are 40 or older, have a CBE every year. Also consider having a breast X-ray (mammogram) every year.  If you have a family history of breast cancer, talk to your health care provider about genetic screening.  If you are at high risk   for breast cancer, talk to your health care provider about having an MRI and a mammogram every year.  Breast cancer gene (BRCA) assessment is recommended for women who have family members with BRCA-related cancers. BRCA-related cancers include: ? Breast. ? Ovarian. ? Tubal. ? Peritoneal cancers.  Results of the assessment will determine the need for genetic counseling and BRCA1 and BRCA2 testing.  Cervical Cancer Your health care provider may recommend that you be screened regularly for cancer of the pelvic organs (ovaries, uterus, and  vagina). This screening involves a pelvic examination, including checking for microscopic changes to the surface of your cervix (Pap test). You may be encouraged to have this screening done every 3 years, beginning at age 22.  For women ages 56-65, health care providers may recommend pelvic exams and Pap testing every 3 years, or they may recommend the Pap and pelvic exam, combined with testing for human papilloma virus (HPV), every 5 years. Some types of HPV increase your risk of cervical cancer. Testing for HPV may also be done on women of any age with unclear Pap test results.  Other health care providers may not recommend any screening for nonpregnant women who are considered low risk for pelvic cancer and who do not have symptoms. Ask your health care provider if a screening pelvic exam is right for you.  If you have had past treatment for cervical cancer or a condition that could lead to cancer, you need Pap tests and screening for cancer for at least 20 years after your treatment. If Pap tests have been discontinued, your risk factors (such as having a new sexual partner) need to be reassessed to determine if screening should resume. Some women have medical problems that increase the chance of getting cervical cancer. In these cases, your health care provider may recommend more frequent screening and Pap tests.  Colorectal Cancer  This type of cancer can be detected and often prevented.  Routine colorectal cancer screening usually begins at 34 years of age and continues through 34 years of age.  Your health care provider may recommend screening at an earlier age if you have risk factors for colon cancer.  Your health care provider may also recommend using home test kits to check for hidden blood in the stool.  A small camera at the end of a tube can be used to examine your colon directly (sigmoidoscopy or colonoscopy). This is done to check for the earliest forms of colorectal  cancer.  Routine screening usually begins at age 33.  Direct examination of the colon should be repeated every 5-10 years through 34 years of age. However, you may need to be screened more often if early forms of precancerous polyps or small growths are found.  Skin Cancer  Check your skin from head to toe regularly.  Tell your health care provider about any new moles or changes in moles, especially if there is a change in a mole's shape or color.  Also tell your health care provider if you have a mole that is larger than the size of a pencil eraser.  Always use sunscreen. Apply sunscreen liberally and repeatedly throughout the day.  Protect yourself by wearing long sleeves, pants, a wide-brimmed hat, and sunglasses whenever you are outside.  Heart disease, diabetes, and high blood pressure  High blood pressure causes heart disease and increases the risk of stroke. High blood pressure is more likely to develop in: ? People who have blood pressure in the high end of  the normal range (130-139/85-89 mm Hg). ? People who are overweight or obese. ? People who are African American.  If you are 21-29 years of age, have your blood pressure checked every 3-5 years. If you are 3 years of age or older, have your blood pressure checked every year. You should have your blood pressure measured twice-once when you are at a hospital or clinic, and once when you are not at a hospital or clinic. Record the average of the two measurements. To check your blood pressure when you are not at a hospital or clinic, you can use: ? An automated blood pressure machine at a pharmacy. ? A home blood pressure monitor.  If you are between 17 years and 37 years old, ask your health care provider if you should take aspirin to prevent strokes.  Have regular diabetes screenings. This involves taking a blood sample to check your fasting blood sugar level. ? If you are at a normal weight and have a low risk for diabetes,  have this test once every three years after 34 years of age. ? If you are overweight and have a high risk for diabetes, consider being tested at a younger age or more often. Preventing infection Hepatitis B  If you have a higher risk for hepatitis B, you should be screened for this virus. You are considered at high risk for hepatitis B if: ? You were born in a country where hepatitis B is common. Ask your health care provider which countries are considered high risk. ? Your parents were born in a high-risk country, and you have not been immunized against hepatitis B (hepatitis B vaccine). ? You have HIV or AIDS. ? You use needles to inject street drugs. ? You live with someone who has hepatitis B. ? You have had sex with someone who has hepatitis B. ? You get hemodialysis treatment. ? You take certain medicines for conditions, including cancer, organ transplantation, and autoimmune conditions.  Hepatitis C  Blood testing is recommended for: ? Everyone born from [birth year] through 1963. ? Anyone with known risk factors for hepatitis C.  Sexually transmitted infections (STIs)  You should be screened for sexually transmitted infections (STIs) including gonorrhea and chlamydia if: ? You are sexually active and are younger than 34 years of age. ? You are older than 34 years of age and your health care provider tells you that you are at risk for this type of infection. ? Your sexual activity has changed since you were last screened and you are at an increased risk for chlamydia or gonorrhea. Ask your health care provider if you are at risk.  If you do not have HIV, but are at risk, it may be recommended that you take a prescription medicine daily to prevent HIV infection. This is called pre-exposure prophylaxis (PrEP). You are considered at risk if: ? You are sexually active and do not regularly use condoms or know the HIV status of your partner(s). ? You take drugs by injection. ? You are  sexually active with a partner who has HIV.  Talk with your health care provider about whether you are at high risk of being infected with HIV. If you choose to begin PrEP, you should first be tested for HIV. You should then be tested every 3 months for as long as you are taking PrEP. Pregnancy  If you are premenopausal and you may become pregnant, ask your health care provider about preconception counseling.  If you may become  pregnant, take 400 to 800 micrograms (mcg) of folic acid every day.  If you want to prevent pregnancy, talk to your health care provider about birth control (contraception). Osteoporosis and menopause  Osteoporosis is a disease in which the bones lose minerals and strength with aging. This can result in serious bone fractures. Your risk for osteoporosis can be identified using a bone density scan.  If you are 47 years of age or older, or if you are at risk for osteoporosis and fractures, ask your health care provider if you should be screened.  Ask your health care provider whether you should take a calcium or vitamin D supplement to lower your risk for osteoporosis.  Menopause may have certain physical symptoms and risks.  Hormone replacement therapy may reduce some of these symptoms and risks. Talk to your health care provider about whether hormone replacement therapy is right for you. Follow these instructions at home:  Schedule regular health, dental, and eye exams.  Stay current with your immunizations.  Do not use any tobacco products including cigarettes, chewing tobacco, or electronic cigarettes.  If you are pregnant, do not drink alcohol.  If you are breastfeeding, limit how much and how often you drink alcohol.  Limit alcohol intake to no more than 1 drink per day for nonpregnant women. One drink equals 12 ounces of beer, 5 ounces of wine, or 1 ounces of hard liquor.  Do not use street drugs.  Do not share needles.  Ask your health care  provider for help if you need support or information about quitting drugs.  Tell your health care provider if you often feel depressed.  Tell your health care provider if you have ever been abused or do not feel safe at home. This information is not intended to replace advice given to you by your health care provider. Make sure you discuss any questions you have with your health care provider. Document Released: 07/15/2010 Document Revised: 06/07/2015 Document Reviewed: 10/03/2014 Elsevier Interactive Patient Education  2018 [employer].  Fatigue Fatigue is feeling tired all of the time, a lack of energy, or a lack of motivation. Occasional or mild fatigue is often a normal response to activity or life in general. However, long-lasting (chronic) or extreme fatigue may indicate an underlying medical condition. Follow these instructions at home: Watch your fatigue for any changes. The following actions may help to lessen any discomfort you are feeling:  Talk to your health care provider about how much sleep you need each night. Try to get the required amount every night.  Take medicines only as directed by your health care provider.  Eat a healthy and nutritious diet. Ask your health care provider if you need help changing your diet.  Drink enough fluid to keep your urine clear or pale yellow.  Practice ways of relaxing, such as yoga, meditation, massage therapy, or acupuncture.  Exercise regularly.  Change situations that cause you stress. Try to keep your work and personal routine reasonable.  Do not abuse illegal drugs.  Limit alcohol intake to no more than 1 drink per day for nonpregnant women and 2 drinks per day for men. One drink equals 12 ounces of beer, 5 ounces of wine, or 1 ounces of hard liquor.  Take a multivitamin, if directed by your health care provider.  Contact a health care provider if:  Your fatigue does not get better.  You have a fever.  You have  unintentional weight loss or gain.  You have  headaches.  You have difficulty: ? Falling asleep. ? Sleeping throughout the night.  You feel angry, guilty, anxious, or sad.  You are unable to have a bowel movement (constipation).  You skin is dry.  Your legs or another part of your body is swollen. Get help right away if:  You feel confused.  Your vision is blurry.  You feel faint or pass out.  You have a severe headache.  You have severe abdominal, pelvic, or back pain.  You have chest pain, shortness of breath, or an irregular or fast heartbeat.  You are unable to urinate or you urinate less than normal.  You develop abnormal bleeding, such as bleeding from the rectum, vagina, nose, lungs, or nipples.  You vomit blood.  You have thoughts about harming yourself or committing suicide.  You are worried that you might harm someone else. This information is not intended to replace advice given to you by your health care provider. Make sure you discuss any questions you have with your health care provider. Document Released: 10/27/2006 Document Revised: 06/07/2015 Document Reviewed: 05/03/2013 Elsevier Interactive Patient Education  2018 [place] 18-39 Years, Female Preventive care refers to lifestyle choices and visits with your health care provider that can promote health and wellness. What does preventive care include?  A yearly physical exam. This is also called an annual well check.  Dental exams once or twice a year.  Routine eye exams. Ask your health care provider how often you should have your eyes checked.  Personal lifestyle choices, including: ? Daily care of your teeth and gums. ? Regular physical activity. ? Eating a healthy diet. ? Avoiding tobacco and drug use. ? Limiting alcohol use. ? Practicing safe sex. ? Taking vitamin and mineral supplements as recommended by your health care provider. What happens during an annual  well check? The services and screenings done by your health care provider during your annual well check will depend on your age, overall health, lifestyle risk factors, and family history of disease. Counseling Your health care provider may ask you questions about your:  Alcohol use.  Tobacco use.  Drug use.  Emotional well-being.  Home and relationship well-being.  Sexual activity.  Eating habits.  Work and work Statistician.  Method of birth control.  Menstrual cycle.  Pregnancy history.  Screening You may have the following tests or measurements:  Height, weight, and BMI.  Diabetes screening. This is done by checking your blood sugar (glucose) after you have not eaten for a while (fasting).  Blood pressure.  Lipid and cholesterol levels. These may be checked every 5 years starting at age 50.  Skin check.  Hepatitis C blood test.  Hepatitis B blood test.  Sexually transmitted disease (STD) testing.  BRCA-related cancer screening. This may be done if you have a family history of breast, ovarian, tubal, or peritoneal cancers.  Pelvic exam and Pap test. This may be done every 3 years starting at age 63. Starting at age 42, this may be done every 5 years if you have a Pap test in combination with an HPV test.  Discuss your test results, treatment options, and if necessary, the need for more tests with your health care provider. Vaccines Your health care provider may recommend certain vaccines, such as:  Influenza vaccine. This is recommended every year.  Tetanus, diphtheria, and acellular pertussis (Tdap, Td) vaccine. You may need a Td booster every 10 years.  Varicella vaccine. You may need this if  you have not been vaccinated.  HPV vaccine. If you are 46 or younger, you may need three doses over 6 months.  Measles, mumps, and rubella (MMR) vaccine. You may need at least one dose of MMR. You may also need a second dose.  Pneumococcal 13-valent conjugate  (PCV13) vaccine. You may need this if you have certain conditions and were not previously vaccinated.  Pneumococcal polysaccharide (PPSV23) vaccine. You may need one or two doses if you smoke cigarettes or if you have certain conditions.  Meningococcal vaccine. One dose is recommended if you are age 11-21 years and a first-year college student living in a residence hall, or if you have one of several medical conditions. You may also need additional booster doses.  Hepatitis A vaccine. You may need this if you have certain conditions or if you travel or work in places where you may be exposed to hepatitis A.  Hepatitis B vaccine. You may need this if you have certain conditions or if you travel or work in places where you may be exposed to hepatitis B.  Haemophilus influenzae type b (Hib) vaccine. You may need this if you have certain risk factors.  Talk to your health care provider about which screenings and vaccines you need and how often you need them. This information is not intended to replace advice given to you by your health care provider. Make sure you discuss any questions you have with your health care provider. Document Released: 02/25/2001 Document Revised: 09/19/2015 Document Reviewed: 10/31/2014 Elsevier Interactive Patient Education  2017 [employer].

## 2016-10-17 NOTE — Progress Notes (Signed)
Patient presents to clinic today to establish care/well check.  SUBJECTIVE: PMH:  Pt is a 36 yo with no sig pmh.  Pt was formerly seen by [facility]  physician [facility].  Pt is also followed by OB/Gyn.  Pt is healthy. Would like labs done.  Has Nexplanon in place 2 years.  Pt mentions having vit D deficiency in the past.  Pt also endorses some decreased energy/feeling tired, but has attributed it to having a young child. Patient not currently taking any medications.  Allergies: NKDA  Past surgical history: None  Social history: Patient has been married for 5 years. She has a 11-year-old son named Paediatric nurse.  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  Patient denies drug, tobacco use. Patient endorses occasional wine drinking.  Family medical history: Mom-breast cancer-2015 Dad-? MGM-DM, HTN MGF-deceased. Lung cancer, tobacco use, HTN, DM PGM-HTN, DM, CVA PGF-HTN, DM Sister-fibroids?  Health Maintenance: Immunizations -- flu vaccine given today.    Past Medical History:  Diagnosis Date  . Anal fissure   . GDM (gestational diabetes mellitus)   . Gestational diabetes    diet controlled  . Hx of condyloma acuminatum   . Hx of varicella   . Vaginal Pap smear, abnormal   . Vitamin D deficiency     Past Surgical History:  Procedure Laterality Date  . NO PAST SURGERIES      Current Outpatient Prescriptions on File Prior to Visit  Medication Sig Dispense Refill  . calcium carbonate (TUMS - DOSED IN MG ELEMENTAL CALCIUM) 500 MG chewable tablet Chew 1 tablet by mouth daily.    . cholecalciferol (VITAMIN D) 1000 UNITS tablet Take 2,000 Units by mouth daily.    . ferrous sulfate 325 (65 FE) MG tablet Take 1 tablet (325 mg total) by mouth daily with breakfast. 30 tablet 0  . hydrochlorothiazide (MICROZIDE) 12.5 MG capsule Take 1 capsule (12.5 mg total) by mouth daily. 5 capsule 0  . ibuprofen (ADVIL,MOTRIN) 600 MG tablet  Take 1 tablet (600 mg total) by mouth every 8 (eight) hours as needed. 30 tablet 0  . magnesium oxide (MAG-OX) 400 (241.3 MG) MG tablet Take 0.5-1 tablets (200-400 mg total) by mouth daily. 90 tablet 0  . Prenatal Multivit-Min-Fe-FA (PRENATAL VITAMINS PO) Take by mouth.     No current facility-administered medications on file prior to visit.     No Known Allergies  Family History  Problem Relation Age of Onset  . Hypertension Mother   . Cancer Mother        breast  . Diabetes Maternal Grandmother   . Hypertension Maternal Grandmother   . Heart attack Maternal Grandfather     Social History   Social History  . Marital status: Married    Spouse name: N/A  . Number of children: N/A  . Years of education: N/A   Occupational History  . Not on file.   Social History Main Topics  . Smoking status: Never Smoker  . Smokeless tobacco: Never Used  . Alcohol use Yes     Comment: [employer]  . Drug use: No  . Sexual activity: Yes    Birth control/ protection: Implant   Other Topics Concern  . Not on file   Social History Narrative  . No narrative on file    ROS General: Denies fever, chills, night sweats, changes in weight, changes in appetite  +tired/decreased energy HEENT: Denies headaches, ear pain, changes  in vision, rhinorrhea, sore throat CV: Denies CP, palpitations, SOB, orthopnea Pulm: Denies SOB, cough, wheezing GI: Denies abdominal pain, nausea, vomiting, diarrhea, constipation GU: Denies dysuria, hematuria, frequency, vaginal discharge Msk: Denies muscle cramps, joint pains Neuro: Denies weakness, numbness, tingling Skin: Denies rashes, bruising Psych: Denies depression, anxiety, hallucinations  BP 100/80 (BP Location: Right Arm, Patient Position: Sitting, Cuff Size: Normal)   Pulse 70   Temp 98.2 F (36.8 C)   Ht  (1.549 m)   Wt 125 lb 11.2 oz (57 kg)   Breastfeeding? No   BMI 23.75 kg/m   Physical Exam Gen. Pleasant, well developed, well-nourished,  in NAD HEENT - [place]/AT, PERRL, no scleral icterus, no nasal drainage, pharynx without erythema or exudate. Neck: No JVD, no thyromegaly Lungs: no accessory muscle use, CTAB, no wheezes, rales or rhonchi Cardiovascular: RRR, No r/g/m, no peripheral edema Abdomen: BS present, soft, nontender,nondistended, no hepatosplenomegaly Musculoskeletal: No deformities, moves all four extremities, no cyanosis or clubbing, normal tone Neuro:  A&Ox3, CN II-XII intact, normal gait Skin:  Warm, dry, intact, no lesions Psych: normal affect, mood appropriate  No results found for this or any previous visit (from the past 2160 hour(s)).  Assessment/Plan: Well adult health check  -Paps and breast exam done at OB/GYN -Anticipatory guidance given -Given handout on age-appropriate health maintenance -Discussed increasing by mouth intake of water, increasing exercise, eating better - Plan: CBC with Differential/Platelet, Comprehensive metabolic panel,   Need for immunization against influenza - Plan: Flu Vaccine QUAD 36+ mos IM  Screening cholesterol level  -Discussed improving diet - Plan: Lipid panel  Vitamin D deficiency  -History of vitamin D deficiency, will recheck lab - Plan: VITAMIN D 25 Hydroxy (Vit-D Deficiency, Fractures), VITAMIN D 25 Hydroxy (Vit-D Deficiency, Fractures)  Decreased energy  -Likely related to decreased sleep secondary to having a 65-year-old. Discussed other possibilities including vitamin D deficiency, thyroid issues, electrolyte imbalances. -Check labs. - Plan: TSH, T4, free  F/u prn.

## 2016-10-23 ENCOUNTER — Other Ambulatory Visit: Payer: Self-pay | Admitting: Family Medicine

## 2016-10-23 DIAGNOSIS — R899 Unspecified abnormal finding in specimens from other organs, systems and tissues: Secondary | ICD-10-CM

## 2016-10-23 MED ORDER — VITAMIN D (ERGOCALCIFEROL) 1.25 MG (50000 UNIT) PO CAPS: 50000.0000 [IU] | ORAL_CAPSULE | ORAL | 0 refills | Status: DC

## 2017-11-23 ENCOUNTER — Ambulatory Visit (INDEPENDENT_AMBULATORY_CARE_PROVIDER_SITE_OTHER): Payer: BC Managed Care – PPO | Admitting: Family Medicine

## 2017-11-23 ENCOUNTER — Encounter: Payer: Self-pay | Admitting: Family Medicine

## 2017-11-23 ENCOUNTER — Other Ambulatory Visit: Payer: Self-pay | Admitting: Family Medicine

## 2017-11-23 VITALS — BP 110/78 | HR 74 | Temp 99.0°F | Wt 137.0 lb

## 2017-11-23 DIAGNOSIS — Z131 Encounter for screening for diabetes mellitus: Secondary | ICD-10-CM

## 2017-11-23 DIAGNOSIS — E559 Vitamin D deficiency, unspecified: Secondary | ICD-10-CM

## 2017-11-23 DIAGNOSIS — Z0000 Encounter for general adult medical examination without abnormal findings: Secondary | ICD-10-CM

## 2017-11-23 LAB — BASIC METABOLIC PANEL
BUN: 18 mg/dL (ref 6–23)
CHLORIDE: 104 meq/L (ref 96–112)
CO2: 30 mEq/L (ref 19–32)
Calcium: 9.7 mg/dL (ref 8.4–10.5)
Creatinine, Ser: 0.7 mg/dL (ref 0.40–1.20)
GFR: 122.53 mL/min (ref >60.00)
GLUCOSE: 84 mg/dL (ref 70–99)
POTASSIUM: 4.1 meq/L (ref 3.5–5.1)
Sodium: 141 mEq/L (ref 135–145)

## 2017-11-23 LAB — VITAMIN D 25 HYDROXY (VIT D DEFICIENCY, FRACTURES): VITD: 14.87 ng/mL — ABNORMAL LOW (ref 30.00–100.00)

## 2017-11-23 LAB — HEMOGLOBIN A1C: Hgb A1c MFr Bld: 5.2 % (ref 4.6–6.5)

## 2017-11-23 MED ORDER — VITAMIN D (ERGOCALCIFEROL) 1.25 MG (50000 UNIT) PO CAPS: 50000.0000 [IU] | ORAL_CAPSULE | ORAL | 0 refills | Status: DC

## 2017-11-23 NOTE — Patient Instructions (Signed)
Preventive Care 18-39 Years, Female Preventive care refers to lifestyle choices and visits with your health care provider that can promote health and wellness. What does preventive care include?  A yearly physical exam. This is also called an annual well check.  Dental exams once or twice a year.  Routine eye exams. Ask your health care provider how often you should have your eyes checked.  Personal lifestyle choices, including: ? Daily care of your teeth and gums. ? Regular physical activity. ? Eating a healthy diet. ? Avoiding tobacco and drug use. ? Limiting alcohol use. ? Practicing safe sex. ? Taking vitamin and mineral supplements as recommended by your health care provider. What happens during an annual well check? The services and screenings done by your health care provider during your annual well check will depend on your age, overall health, lifestyle risk factors, and family history of disease. Counseling Your health care provider may ask you questions about your:  Alcohol use.  Tobacco use.  Drug use.  Emotional well-being.  Home and relationship well-being.  Sexual activity.  Eating habits.  Work and work Statistician.  Method of birth control.  Menstrual cycle.  Pregnancy history.  Screening You may have the following tests or measurements:  Height, weight, and BMI.  Diabetes screening. This is done by checking your blood sugar (glucose) after you have not eaten for a while (fasting).  Blood pressure.  Lipid and cholesterol levels. These may be checked every 5 years starting at age 66.  Skin check.  Hepatitis C blood test.  Hepatitis B blood test.  Sexually transmitted disease (STD) testing.  BRCA-related cancer screening. This may be done if you have a family history of breast, ovarian, tubal, or peritoneal cancers.  Pelvic exam and Pap test. This may be done every 3 years starting at age 40. Starting at age 59, this may be done every 5  years if you have a Pap test in combination with an HPV test.  Discuss your test results, treatment options, and if necessary, the need for more tests with your health care provider. Vaccines Your health care provider may recommend certain vaccines, such as:  Influenza vaccine. This is recommended every year.  Tetanus, diphtheria, and acellular pertussis (Tdap, Td) vaccine. You may need a Td booster every 10 years.  Varicella vaccine. You may need this if you have not been vaccinated.  HPV vaccine. If you are 69 or younger, you may need three doses over 6 months.  Measles, mumps, and rubella (MMR) vaccine. You may need at least one dose of MMR. You may also need a second dose.  Pneumococcal 13-valent conjugate (PCV13) vaccine. You may need this if you have certain conditions and were not previously vaccinated.  Pneumococcal polysaccharide (PPSV23) vaccine. You may need one or two doses if you smoke cigarettes or if you have certain conditions.  Meningococcal vaccine. One dose is recommended if you are age 27-21 years and a first-year college student living in a residence hall, or if you have one of several medical conditions. You may also need additional booster doses.  Hepatitis A vaccine. You may need this if you have certain conditions or if you travel or work in places where you may be exposed to hepatitis A.  Hepatitis B vaccine. You may need this if you have certain conditions or if you travel or work in places where you may be exposed to hepatitis B.  Haemophilus influenzae type b (Hib) vaccine. You may need this if  you have certain risk factors.  Talk to your health care provider about which screenings and vaccines you need and how often you need them. This information is not intended to replace advice given to you by your health care provider. Make sure you discuss any questions you have with your health care provider. Document Released: 02/25/2001 Document Revised: 09/19/2015  Document Reviewed: 10/31/2014 Elsevier Interactive Patient Education  [employer].

## 2017-11-23 NOTE — Progress Notes (Signed)
Subjective:     Kendra Sparks is a 37 y.o. female and is here for a comprehensive physical exam. The patient reports no problems.  Not currently on meds.  Nexplanon in place x3 yrs.  Pt would like Nexplanon removed as would like to try to have another child.  Pt also followed by OB/Gyn.  Received Influenza vaccine 10/30/17.  Social History   Socioeconomic History  . Marital status: Married    Spouse name: Not on file  . Number of children: Not on file  . Years of education: Not on file  . Highest education level: Not on file  Occupational History  . Not on file  Social Needs  . Financial resource strain: Not on file  . Food insecurity:    Worry: Not on file    Inability: Not on file  . Transportation needs:    Medical: Not on file    Non-medical: Not on file  Tobacco Use  . Smoking status: Never Smoker  . Smokeless tobacco: Never Used  Substance and Sexual Activity  . Alcohol use: Yes    Comment: [employer]  . Drug use: No  . Sexual activity: Yes    Birth control/protection: Implant  Lifestyle  . Physical activity:    Days per week: Not on file    Minutes per session: Not on file  . Stress: Not on file  Relationships  . Social connections:    Talks on phone: Not on file    Gets together: Not on file    Attends religious service: Not on file    Active member of club or organization: Not on file    Attends meetings of clubs or organizations: Not on file    Relationship status: Not on file  . Intimate partner violence:    Fear of current or ex partner: Not on file    Emotionally abused: Not on file    Physically abused: Not on file    Forced sexual activity: Not on file  Other Topics Concern  . Not on file  Social History Narrative  . Not on file   Health Maintenance  Topic Date Due  . HEMOGLOBIN A1C  08/22/82  . PNEUMOCOCCAL POLYSACCHARIDE VACCINE AGE 80-64 HIGH RISK  12/27/1984  . FOOT EXAM  12/27/1992  . OPHTHALMOLOGY EXAM  12/27/1992  .  URINE MICROALBUMIN  12/27/1992  . TETANUS/TDAP  12/27/2001  . INFLUENZA VACCINE  08/13/2017  . PAP SMEAR  10/04/2019  . HIV Screening  Completed    The following portions of the patient's history were reviewed and updated as appropriate: allergies, current medications, past family history, past medical history, past social history, past surgical history and problem list.  Review of Systems A comprehensive review of systems was negative.   Objective:    BP 110/78 (BP Location: Left Arm, Patient Position: Sitting, Cuff Size: Normal)   Pulse 74   Temp 99 F (37.2 C) (Oral)   Wt 137 lb (62.1 kg)   LMP 09/30/2017 (Approximate)   SpO2 98%   BMI 25.89 kg/m  General appearance: alert, cooperative and no distress Head: Normocephalic, without obvious abnormality, atraumatic Eyes: conjunctivae/corneas clear. PERRL, EOM's intact. Fundi benign. Ears: normal TM's and external ear canals both ears Nose: Nares normal. Septum midline. Mucosa normal. No drainage or sinus tenderness. Throat: lips, mucosa, and tongue normal; teeth and gums normal Neck: no adenopathy, no carotid bruit, no JVD, supple, symmetrical, trachea midline and thyroid not enlarged, symmetric, no tenderness/mass/nodules Lungs: clear  to auscultation bilaterally Heart: regular rate and rhythm, S1, S2 normal, no murmur, click, rub or gallop Abdomen: soft, non-tender; bowel sounds normal; no masses,  no organomegaly Extremities: extremities normal, atraumatic, no cyanosis or edema Skin: Skin color, texture, turgor normal. No rashes or lesions  Nexplanon in place R arm Neurologic: Alert and oriented X 3, normal strength and tone. Normal symmetric reflexes. Normal coordination and gait    Assessment:    Healthy female exam.      Plan:     Anticipatory guidance given including wearing seatbelts, smoke detectors in the home, increasing physical activity, increasing p.o. intake of water and vegetables. -will obtain BMP, Hgb A1C,  and Vitamin D -Pap up to date.  Continue f/u with OB/Gyn -Given handout -Next CPE in 1 yr. See After Visit Summary for Counseling Recommendations    Pt to schedule appt to have Nexplanon removed.  F/u prn  Micheline Rough, MD

## 2017-11-24 ENCOUNTER — Ambulatory Visit (INDEPENDENT_AMBULATORY_CARE_PROVIDER_SITE_OTHER): Payer: BC Managed Care – PPO | Admitting: Family Medicine

## 2017-11-24 ENCOUNTER — Encounter: Payer: Self-pay | Admitting: Family Medicine

## 2017-11-24 VITALS — BP 106/70 | HR 68 | Temp 99.0°F

## 2017-11-24 DIAGNOSIS — Z3046 Encounter for surveillance of implantable subdermal contraceptive: Secondary | ICD-10-CM | POA: Diagnosis not present

## 2017-11-24 NOTE — Patient Instructions (Signed)
Your nexplanon was removed today.  You should keep the pressure bandage in place for 24 hours, then remove it.  You should leave the bandaid and steri strips in place for at least 5 days.  The steri strips will fall off on their own.  You can use ice on your arm to help reduce any swelling.    Please monitor your arm for signs of infection including redness, increased warmth, fever, or drainage from the site.    Injury Nexplanon contraceptive device was removed you can now potentially conceive in any time

## 2017-11-24 NOTE — Progress Notes (Signed)
Subjective:    Patient ID: Kendra Sparks, female    DOB: 07/23/1980, 37 y.o.   MRN: [id number]  No chief complaint on file.   HPI Patient was seen today for Nexplanon removal.  Device has been in place x 3 yrs, due for removal in Aug.  Pt and husband would like to have another child.  Past Medical History:  Diagnosis Date  . Anal fissure   . GDM (gestational diabetes mellitus)   . Gestational diabetes    diet controlled  . Hx of condyloma acuminatum   . Hx of varicella   . Vaginal Pap smear, abnormal   . Vitamin D deficiency     No Known Allergies  ROS General: Denies fever, chills, night sweats, changes in weight, changes in appetite HEENT: Denies headaches, ear pain, changes in vision, rhinorrhea, sore throat CV: Denies CP, palpitations, SOB, orthopnea Pulm: Denies SOB, cough, wheezing GI: Denies abdominal pain, nausea, vomiting, diarrhea, constipation GU: Denies dysuria, hematuria, frequency, vaginal discharge Msk: Denies muscle cramps, joint pains Neuro: Denies weakness, numbness, tingling Skin: Denies rashes, bruising   +nexplanon in place R arm. Psych: Denies depression, anxiety, hallucinations     Objective:    Blood pressure 106/70, pulse 68, temperature 99 F (37.2 C), temperature source Oral, SpO2 98 %.  Gen. Pleasant, well-nourished, in no distress, normal affect  Lungs: no accessory muscle use Cardiovascular: RRR, no peripheral edema Skin:  Warm, no lesions/ rash.  Nexplanon palpable in R arm.  Wt Readings from Last 3 Encounters:  11/23/17 137 lb (62.1 kg)  10/17/16 125 lb 11.2 oz (57 kg)  08/30/14 164 lb 12.8 oz (74.8 kg)    Lab Results  Component Value Date   WBC 16.5 (H) 08/31/2014   HGB 9.6 (L) 08/31/2014   HCT 28.5 (L) 08/31/2014   PLT 137 (L) 08/31/2014   GLUCOSE 84 11/23/2017   CHOL 177 10/17/2016   TRIG 38.0 10/17/2016   HDL 75.30 10/17/2016   LDLCALC 94 10/17/2016   ALT 8 10/17/2016   AST 12 10/17/2016   NA 141  11/23/2017   K 4.1 11/23/2017   CL 104 11/23/2017   CREATININE 0.70 11/23/2017   BUN 18 11/23/2017   CO2 30 11/23/2017   TSH 1.44 10/17/2016   INR 1.14 08/28/2014   HGBA1C 5.2 11/23/2017    Assessment/Plan:  Encounter for Nexplanon removal  -see procedure note below  PROCEDURE NOTE: NEXPLANON  REMOVAL Consent obtained. Right arm area prepped and draped in the usual sterile fashion. Lidocaine without epinephrine 1% used for local anesthesia. A small stab incision was made close to the nexplanon with scalpel. Hemostats were used to withdraw the nexplanon. A small bandage was applied over a steri strip  No complications. Patient given follow up instructions should she experience redness, swelling at sight or fever in the next 24 hours. Patient was reminded this totally removes her nexplanon contraceptive devise. (she can now potentially conceive)  Abbe AmsterdamShannon , MD

## 2018-02-11 ENCOUNTER — Other Ambulatory Visit: Payer: Self-pay | Admitting: Family Medicine

## 2018-02-11 DIAGNOSIS — E559 Vitamin D deficiency, unspecified: Secondary | ICD-10-CM

## 2018-02-11 NOTE — Telephone Encounter (Signed)
This is a one time Rx and pt is aware to start taking OTC Vit D

## 2018-07-05 ENCOUNTER — Ambulatory Visit: Payer: BC Managed Care – PPO | Admitting: Family Medicine

## 2018-07-06 ENCOUNTER — Other Ambulatory Visit: Payer: Self-pay

## 2018-07-06 ENCOUNTER — Ambulatory Visit: Payer: BC Managed Care – PPO | Admitting: Family Medicine

## 2018-07-06 ENCOUNTER — Encounter: Payer: Self-pay | Admitting: Family Medicine

## 2018-07-06 DIAGNOSIS — K625 Hemorrhage of anus and rectum: Secondary | ICD-10-CM

## 2018-07-06 DIAGNOSIS — K5900 Constipation, unspecified: Secondary | ICD-10-CM

## 2018-07-06 NOTE — Progress Notes (Signed)
Virtual Visit via Video Note  I connected with Kendra Sparks on 07/06/18 at  3:00 PM EDT by a video enabled telemedicine application and verified that I am speaking with the correct person using two identifiers.  Location patient: home Location provider:work or home office Persons participating in the virtual visit: patient, provider  I discussed the limitations of evaluation and management by telemedicine and the availability of in person appointments. The patient expressed understanding and agreed to proceed.   HPI: Pt states x 6 yrs she has been having issues with constipation.  In the past pt had an anal fissure.  She feels like she is having similar symptoms. Pt tried metamucil in the past but did not like it.  She has been increasing her fiber and water intake.  Noticing blood on stool, still having constipation. Denies fatigue, dizziness, abdominal pain, n/v.  She saw Dr. Collene Mares, GI six yrs ago.  At that time she was also told she had hemorrhoids.  She was given diltiazem gel and sent to another GI provider?  States was advised by 2nd GI provider that treatment of her hemorrhoids was not recommended.    ROS: See pertinent positives and negatives per HPI.  Past Medical History:  Diagnosis Date  . Anal fissure   . GDM (gestational diabetes mellitus)   . Gestational diabetes    diet controlled  . Hx of condyloma acuminatum   . Hx of varicella   . Vaginal Pap smear, abnormal   . Vitamin D deficiency     Past Surgical History:  Procedure Laterality Date  . NO PAST SURGERIES      Family History  Problem Relation Age of Onset  . Hypertension Mother   . Cancer Mother        breast  . Diabetes Maternal Grandmother   . Hypertension Maternal Grandmother   . Heart attack Maternal Grandfather     SOCIAL HX: Pt is at home with her 36 yo son.  Pt's husband is going in to work.   Current Outpatient Medications:  Marland Kitchen  Vitamin D, Ergocalciferol, (DRISDOL) 1.25 MG (50000 UT) CAPS  capsule, Take 1 capsule (50,000 Units total) by mouth every 7 (seven) days., Disp: 12 capsule, Rfl: 0  EXAM:  VITALS per patient if applicable: RR between 40-98 bpm  GENERAL: alert, oriented, appears well and in no acute distress  HEENT: atraumatic, conjunctiva clear, no obvious abnormalities on inspection of external nose and ears  NECK: normal movements of the head and neck  LUNGS: on inspection no signs of respiratory distress, breathing rate appears normal, no obvious gross SOB, gasping or wheezing  CV: no obvious cyanosis  MS: moves all visible extremities without noticeable abnormality  PSYCH/NEURO: pleasant and cooperative, no obvious depression or anxiety, speech and thought processing grossly intact  ASSESSMENT AND PLAN:  Discussed the following assessment and plan:  Constipation, unspecified constipation type  -continue increasing fiber and water intake -discussed using miralax prn   Rectal bleeding  -given bleeding pt to be seen in clinic for exam.  Pt would prefer an early appt or an appt after 5 pm  -discussed possible causes including fissure, hemorrhoids, etc. -given precautions -advised should follow up with GI given hx.  Will work to get pt schedule for in office appt this wk.   I discussed the assessment and treatment plan with the patient. The patient was provided an opportunity to ask questions and all were answered. The patient agreed with the plan and demonstrated an  understanding of the instructions.   The patient was advised to call back or seek an in-person evaluation if the symptoms worsen or if the condition fails to improve as anticipated.   Deeann SaintShannon R Banks, MD

## 2018-07-09 ENCOUNTER — Other Ambulatory Visit: Payer: Self-pay

## 2018-07-09 ENCOUNTER — Encounter: Payer: Self-pay | Admitting: Internal Medicine

## 2018-07-09 ENCOUNTER — Ambulatory Visit (INDEPENDENT_AMBULATORY_CARE_PROVIDER_SITE_OTHER): Payer: BC Managed Care – PPO | Admitting: Internal Medicine

## 2018-07-09 VITALS — BP 110/80 | HR 96 | Temp 98.7°F | Wt 138.9 lb

## 2018-07-09 DIAGNOSIS — K649 Unspecified hemorrhoids: Secondary | ICD-10-CM | POA: Diagnosis not present

## 2018-07-09 DIAGNOSIS — N912 Amenorrhea, unspecified: Secondary | ICD-10-CM

## 2018-07-09 LAB — POCT URINE PREGNANCY: Preg Test, Ur: POSITIVE — AB

## 2018-07-09 MED ORDER — HYDROCORTISONE (PERIANAL) 2.5 % EX CREA: 1.0000 "application " | TOPICAL_CREAM | Freq: Two times a day (BID) | CUTANEOUS | 0 refills | Status: DC

## 2018-07-09 MED ORDER — DILTIAZEM GEL 2 %
1.0000 | Freq: Two times a day (BID) | CUTANEOUS | 2 refills | Status: DC
Start: 2018-07-09 — End: 2019-01-17

## 2018-07-09 NOTE — Progress Notes (Signed)
Acute Office Visit     CC/Reason for Visit: discuss hemorrhoids and rectal bleeding and menstrual irregularity  HPI: Kendra Sparks is a 38 y.o. female who is coming in today for the above mentioned reasons. She saw Dr. Salomon FickBanks on video earlier this week for this problem and was asked to come in office for evaluation. She has had external hemorrhoids in the past; saw GI who gave her diltiazem cream and referred her to general surgery for excision. For reasons unclear to me, she was told she was not a good candidate to have them excised. For about 2-3 weeks she has been having rectal burning, has been constipated and has noticed streaks of red blood in her stool. Not dizzy, SOB or lightheaded. She also mentions that PCP removed her nexplanon in December and since then has been having menstrual irregularities. Took a pregnancy test at home 1 month ago that was negative.  Past Medical/Surgical History: Past Medical History:  Diagnosis Date  . Anal fissure   . GDM (gestational diabetes mellitus)   . Gestational diabetes    diet controlled  . Hx of condyloma acuminatum   . Hx of varicella   . Vaginal Pap smear, abnormal   . Vitamin D deficiency     Past Surgical History:  Procedure Laterality Date  . NO PAST SURGERIES      Social History:  reports that she has never smoked. She has never used smokeless tobacco. She reports current alcohol use. She reports that she does not use drugs.  Allergies: No Known Allergies  Family History:  Family History  Problem Relation Age of Onset  . Hypertension Mother   . Cancer Mother        breast  . Diabetes Maternal Grandmother   . Hypertension Maternal Grandmother   . Heart attack Maternal Grandfather      Current Outpatient Medications:  .  diltiazem 2 % GEL, Apply 1 application topically 2 (two) times daily., Disp: 30 g, Rfl: 2 .  hydrocortisone (ANUSOL-HC) 2.5 % rectal cream, Place 1 application rectally 2 (two)  times daily., Disp: 30 g, Rfl: 0 .  Vitamin D, Ergocalciferol, (DRISDOL) 1.25 MG (50000 UT) CAPS capsule, Take 1 capsule (50,000 Units total) by mouth every 7 (seven) days. (Patient not taking: Reported on 07/09/2018), Disp: 12 capsule, Rfl: 0  Review of Systems:  Constitutional: Denies fever, chills, diaphoresis, appetite change and fatigue.  HEENT: Denies photophobia, eye pain, redness, hearing loss, ear pain, congestion, sore throat, rhinorrhea, sneezing, mouth sores, trouble swallowing, neck pain, neck stiffness and tinnitus.   Respiratory: Denies SOB, DOE, cough, chest tightness,  and wheezing.   Cardiovascular: Denies chest pain, palpitations and leg swelling.  Gastrointestinal: Denies nausea, vomiting, abdominal pain, diarrhea, and abdominal distention.  Genitourinary: Denies dysuria, urgency, frequency, hematuria, flank pain and difficulty urinating.  Endocrine: Denies: hot or cold intolerance, sweats, changes in hair or nails, polyuria, polydipsia. Musculoskeletal: Denies myalgias, back pain, joint swelling, arthralgias and gait problem.  Skin: Denies pallor, rash and wound.  Neurological: Denies dizziness, seizures, syncope, weakness, light-headedness, numbness and headaches.  Hematological: Denies adenopathy. Easy bruising, personal or family bleeding history  Psychiatric/Behavioral: Denies suicidal ideation, mood changes, confusion, nervousness, sleep disturbance and agitation    Physical Exam: Vitals:   07/09/18 0726  BP: 110/80  Pulse: 96  Temp: 98.7 F (37.1 C)  TempSrc: Oral  SpO2: 96%  Weight: 138 lb 14.4 oz (63 kg)    Body mass index is  26.24 kg/m.   Constitutional: NAD, calm, comfortable Eyes: PERRL, lids and conjunctivae normal ENMT: Mucous membranes are moist.  Abdomen: no tenderness, no masses palpated. No hepatosplenomegaly. Bowel sounds positive.  Musculoskeletal: no clubbing / cyanosis. No joint deformity upper and lower extremities. Good ROM, no  contractures. Normal muscle tone.  Skin: no rashes, lesions, ulcers. No induration Neurologic: grossly intact and nonfocal Psychiatric: Normal judgment and insight. Alert and oriented x 3. Normal mood.    Impression and Plan:  Hemorrhoids, unspecified hemorrhoid type  -She did not allow me to look, states she knows what it is, she has had it before. -Anusol and diltiazem creams provided. -Referral to general surgery initiated.  Amenorrhea  -POCT u preg is positive. -Did prenatal counseling, asked to take a prenatal vitamin daily and she will scheduled appt with GYN ASAP.     Patient Instructions  -Nice seeing you today!!  -diltiazem gel BID to hemorrhoids.  -Referral to surgery has been placed.  -Please make sure you follow up with GYN as soon as possible.     Lelon Frohlich, MD [facility]

## 2018-07-09 NOTE — Patient Instructions (Addendum)
-  Nice seeing you today!!  -diltiazem gel BID to hemorrhoids.  -Referral to surgery has been placed.  -Please make sure you follow up with GYN as soon as possible.

## 2018-07-12 ENCOUNTER — Telehealth: Payer: Self-pay

## 2018-07-12 DIAGNOSIS — K649 Unspecified hemorrhoids: Secondary | ICD-10-CM

## 2018-07-12 NOTE — Telephone Encounter (Signed)
Copied from [place] [id number]. Topic: General - Other >> Jul 12, 2018  3:15 PM Rainey Pines A wrote: Patient would like callback from Dr. Jerilee Hoh nurse in regards to referral for surgeon in regards to hemorrhoids .

## 2018-07-15 NOTE — Telephone Encounter (Signed)
Dr Hernandez pt 

## 2018-07-15 NOTE — Telephone Encounter (Signed)
Spoke with patient and referral placed

## 2018-07-23 LAB — OB RESULTS CONSOLE RPR: RPR: NONREACTIVE

## 2018-07-23 LAB — OB RESULTS CONSOLE RUBELLA ANTIBODY, IGM: Rubella: IMMUNE

## 2018-07-23 LAB — OB RESULTS CONSOLE GC/CHLAMYDIA
Chlamydia: NEGATIVE
Gonorrhea: NEGATIVE

## 2018-07-23 LAB — OB RESULTS CONSOLE HIV ANTIBODY (ROUTINE TESTING): HIV: NONREACTIVE

## 2018-07-23 LAB — OB RESULTS CONSOLE ABO/RH: RH Type: POSITIVE

## 2018-07-23 LAB — OB RESULTS CONSOLE HEPATITIS B SURFACE ANTIGEN: Hepatitis B Surface Ag: NEGATIVE

## 2018-07-23 LAB — OB RESULTS CONSOLE ANTIBODY SCREEN: Antibody Screen: NEGATIVE

## 2018-08-09 ENCOUNTER — Encounter (HOSPITAL_COMMUNITY): Payer: Self-pay | Admitting: *Deleted

## 2018-08-12 ENCOUNTER — Ambulatory Visit (HOSPITAL_COMMUNITY): Payer: BC Managed Care – PPO | Attending: Obstetrics and Gynecology

## 2018-08-12 ENCOUNTER — Encounter (HOSPITAL_COMMUNITY): Payer: Self-pay

## 2018-09-02 ENCOUNTER — Ambulatory Visit (HOSPITAL_COMMUNITY): Payer: BC Managed Care – PPO

## 2018-09-07 ENCOUNTER — Ambulatory Visit (HOSPITAL_COMMUNITY): Payer: BC Managed Care – PPO | Admitting: *Deleted

## 2018-09-07 ENCOUNTER — Encounter (HOSPITAL_COMMUNITY): Payer: Self-pay

## 2018-09-07 ENCOUNTER — Other Ambulatory Visit: Payer: Self-pay

## 2018-09-07 ENCOUNTER — Ambulatory Visit (HOSPITAL_COMMUNITY): Payer: BC Managed Care – PPO | Attending: Obstetrics and Gynecology | Admitting: Maternal & Fetal Medicine

## 2018-09-07 VITALS — BP 104/60 | HR 77 | Temp 98.5°F | Wt 144.0 lb

## 2018-09-07 DIAGNOSIS — O09292 Supervision of pregnancy with other poor reproductive or obstetric history, second trimester: Secondary | ICD-10-CM | POA: Diagnosis not present

## 2018-09-07 DIAGNOSIS — Z3482 Encounter for supervision of other normal pregnancy, second trimester: Secondary | ICD-10-CM | POA: Diagnosis present

## 2018-09-07 DIAGNOSIS — Z3A19 19 weeks gestation of pregnancy: Secondary | ICD-10-CM

## 2018-09-07 DIAGNOSIS — O09299 Supervision of pregnancy with other poor reproductive or obstetric history, unspecified trimester: Secondary | ICD-10-CM

## 2018-09-07 NOTE — Progress Notes (Addendum)
MFM Consultation Requesting provider: Servando Salina Date of Service: 09/07/18 Reason for request:history of IUGR and HELLP Syndrome:   Kendra Sparks is a 38 yo G2P1 at 25 w 1 d dated by a 12 week scan with EDD of 01/30/18. She is here at there request of Dr. Garwin Brothers for counseling of prior HELLP syndrome and IUGR.  She is doing well without complaints. She denies vaginal bleeding, loss of fluid or uterine contractions.  Her history is complicated by HELLP syndrome with symptoms presenting 4 days prior to delivery. Her symptoms included malaise, fatigue and headache. She reports that her blood pressure was elevated and her platelets were low. She was being monitored for SGA vs IUGR. She doesn't recall anything else.   She delivered 38+ weeks over VAVD. Her son is 49 years old now and doing well.  Her prenatal care has been unremarkable. She has normal labs, normal panorama and had a normal early GTT given her history of A1GDM.   She is scheduled to repeat testing a 28 weeks.     Past Medical History:  Diagnosis Date  . Anal fissure   . GDM (gestational diabetes mellitus)   . Gestational diabetes    diet controlled  . Hx of condyloma acuminatum   . Hx of varicella   . Vaginal Pap smear, abnormal   . Vitamin D deficiency     Past Surgical History:  Procedure Laterality Date  . NO PAST SURGERIES     Family History  Problem Relation Age of Onset  . Hypertension Mother   . Cancer Mother        breast  . Diabetes Maternal Grandmother   . Hypertension Maternal Grandmother   . Heart attack Maternal Grandfather    OB History  Gravida Para Term Preterm AB Living  _0 0 0 1  SAB TAB Ectopic Multiple Live Births  0 0 0 0 1    # Outcome Date GA Lbr Len/2nd Weight Sex Delivery Anes PTL Lv  2 Current           1 Term 08/29/14 [id number] / 00:45 6 lb 9.3 oz (2.985 kg) M Vag-Vacuum Local  LIV     Name: Sparks,BOY Kendra     Apgar1: 8  Apgar5: 9    Current  Outpatient Medications on File Prior to Visit  Medication Sig Dispense Refill  . diltiazem 2 % GEL Apply 1 application topically 2 (two) times daily. (Patient not taking: Reported on 09/07/2018) 30 g 2  . hydrocortisone (ANUSOL-HC) 2.5 % rectal cream Place 1 application rectally 2 (two) times daily. (Patient not taking: Reported on 09/07/2018) 30 g 0  . Vitamin D, Ergocalciferol, (DRISDOL) 1.25 MG (50000 UT) CAPS capsule Take 1 capsule (50,000 Units total) by mouth every 7 (seven) days. (Patient not taking: Reported on 07/09/2018) 12 capsule 0   No current facility-administered medications on file prior to visit.    Relationships  Social connections  . Talks on phone: Not on file  . Gets together: Not on file  . Attends religious service: Not on file  . Active member of club or organization: Not on file  . Attends meetings of clubs or organizations: Not on file  . Relationship status: Not on file      Impression/Counseling:  Prior history of term HELLP syndrome and IUGR:  I met with Kendra Sparks discuss her history as above. I discussed that given her history at term there is likely a 25-50% chance  of recurrence at term but possibly earlier. She doesn't have any known additional risk factors to increase her risk.   She is currently taking low dose ASA as preeclampsia preventative measure. Also, Kendra Sparks had suspected IUGR given that we recommend serial growth exams at 28 weeks.  Recommendations: Continue low dose daily ASA Serial growth exams beginning at 28 weeks Routine prenatal care with specific evaluation of blood pressure Baseline laboratory values Consider delivery by 39 week.  I spent 40 minutes with Kendra Sparks with >50% in face to face consultation and care coordination.  Corenthian J. Booker, MD 

## 2018-11-08 ENCOUNTER — Telehealth: Payer: Self-pay | Admitting: Family Medicine

## 2018-11-08 NOTE — Telephone Encounter (Signed)
Pt wants advise on if she has to have a CPE since she is expecting. Please advise

## 2018-11-08 NOTE — Telephone Encounter (Signed)
Ok to cancel the appt

## 2018-11-08 NOTE — Telephone Encounter (Signed)
Called pt to get her appointment CPE r/s'd due to the change in Dr. Volanda Napoleon schedule.  Pt is expecting a little bundle of joy and wanted to know if the CPE appointment is really necessary.  Pt would like to have a call to let her know she did not want to go ahead an schedule the appointment on 12/17:2020 at 4:00.

## 2018-11-09 NOTE — Telephone Encounter (Signed)
Pt appointment  was cancelled 

## 2018-11-17 ENCOUNTER — Other Ambulatory Visit: Payer: Self-pay

## 2018-11-17 ENCOUNTER — Encounter: Payer: Self-pay | Admitting: Registered"

## 2018-11-17 ENCOUNTER — Encounter: Payer: BC Managed Care – PPO | Attending: Obstetrics and Gynecology | Admitting: Registered"

## 2018-11-17 DIAGNOSIS — O24410 Gestational diabetes mellitus in pregnancy, diet controlled: Secondary | ICD-10-CM | POA: Diagnosis present

## 2018-11-17 NOTE — Progress Notes (Signed)
Patient was seen on 11/17/2018 for Gestational Diabetes self-management class at the [facility]. The following learning objectives were met by the patient during this course:   States the definition of Gestational Diabetes  States why dietary management is important in controlling blood glucose  Describes the effects each nutrient has on blood glucose levels  Demonstrates ability to create a balanced meal plan  Demonstrates carbohydrate counting   States when to check blood glucose levels  Demonstrates proper blood glucose monitoring techniques  States the effect of stress and exercise on blood glucose levels  States the importance of limiting caffeine and abstaining from alcohol and smoking  Blood glucose monitor given: none  Patient instructed to monitor glucose levels: FBS: 60 - <95; 1 hour: <140; 2 hour: <120  Patient received handouts:  Nutrition Diabetes and Pregnancy, including carb counting list  Patient will be seen for follow-up as needed.

## 2018-11-26 ENCOUNTER — Encounter: Payer: BC Managed Care – PPO | Admitting: Family Medicine

## 2019-01-05 LAB — OB RESULTS CONSOLE GBS: GBS: POSITIVE

## 2019-01-13 ENCOUNTER — Telehealth (HOSPITAL_COMMUNITY): Payer: Self-pay | Admitting: *Deleted

## 2019-01-13 ENCOUNTER — Encounter (HOSPITAL_COMMUNITY): Payer: Self-pay | Admitting: *Deleted

## 2019-01-13 NOTE — Telephone Encounter (Signed)
Preadmission screen  

## 2019-01-14 NOTE — L&D Delivery Note (Signed)
Operative Delivery Note At 2:21 PM a viable and healthy female was delivered via Vaginal, Vacuum Investment banker, operational).  Presentation: vertex; Position: Left,, Occiput,, Anterior; Station: +3.  Verbal consent: obtained from patient.  Risks and benefits discussed in detail.  Risks include, but are not limited to the risks of anesthesia, bleeding, infection, damage to maternal tissues, fetal cephalhematoma.  There is also the risk of inability to effect vaginal delivery of the head, or shoulder dystocia that cannot be resolved by established maneuvers, leading to the need for emergency cesarean section. Indication: fetal heart rate deceleration APGAR: 9, 9; weight 7 lb 1.2 oz (3210 g).   Placenta status: spontaneous intact not sent , .   Cord:  with the following complications: CAN x 1, body cord .  Cord pH: n/a  Anesthesia:  epidural Instruments: mushroom vacuum Episiotomy: None Lacerations: None Suture Repair: n/a Est. Blood Loss (mL): 100  Mom to postpartum.  Baby to Couplet care / Skin to Skin.  Cariana Karge A Laneya Gasaway 01/17/2019, 3:44 PM

## 2019-01-17 ENCOUNTER — Inpatient Hospital Stay (HOSPITAL_COMMUNITY)
Admission: AD | Admit: 2019-01-17 | Discharge: 2019-01-19 | DRG: 806 | Disposition: A | Discharge status: Home / Self Care | Payer: BC Managed Care – PPO | Attending: Obstetrics and Gynecology | Admitting: Obstetrics and Gynecology

## 2019-01-17 ENCOUNTER — Encounter (HOSPITAL_COMMUNITY): Payer: Self-pay | Admitting: Obstetrics and Gynecology

## 2019-01-17 ENCOUNTER — Inpatient Hospital Stay (HOSPITAL_COMMUNITY): Payer: BC Managed Care – PPO | Admitting: Anesthesiology

## 2019-01-17 ENCOUNTER — Other Ambulatory Visit: Payer: Self-pay

## 2019-01-17 DIAGNOSIS — D62 Acute posthemorrhagic anemia: Secondary | ICD-10-CM | POA: Diagnosis not present

## 2019-01-17 DIAGNOSIS — O99820 Streptococcus B carrier state complicating pregnancy: Secondary | ICD-10-CM | POA: Diagnosis present

## 2019-01-17 DIAGNOSIS — O24410 Gestational diabetes mellitus in pregnancy, diet controlled: Secondary | ICD-10-CM | POA: Diagnosis present

## 2019-01-17 DIAGNOSIS — Z3A38 38 weeks gestation of pregnancy: Secondary | ICD-10-CM

## 2019-01-17 DIAGNOSIS — Z20822 Contact with and (suspected) exposure to covid-19: Secondary | ICD-10-CM | POA: Diagnosis present

## 2019-01-17 DIAGNOSIS — O26893 Other specified pregnancy related conditions, third trimester: Secondary | ICD-10-CM | POA: Diagnosis present

## 2019-01-17 DIAGNOSIS — O9081 Anemia of the puerperium: Secondary | ICD-10-CM | POA: Diagnosis not present

## 2019-01-17 DIAGNOSIS — O9912 Other diseases of the blood and blood-forming organs and certain disorders involving the immune mechanism complicating childbirth: Secondary | ICD-10-CM | POA: Diagnosis present

## 2019-01-17 DIAGNOSIS — D6959 Other secondary thrombocytopenia: Secondary | ICD-10-CM | POA: Diagnosis present

## 2019-01-17 LAB — GLUCOSE, CAPILLARY
Glucose-Capillary: 45 mg/dL — ABNORMAL LOW (ref 70–99)
Glucose-Capillary: 69 mg/dL — ABNORMAL LOW (ref 70–99)

## 2019-01-17 LAB — COMPREHENSIVE METABOLIC PANEL
ALT: 28 U/L (ref 0–44)
AST: 30 U/L (ref 15–41)
Albumin: 3 g/dL — ABNORMAL LOW (ref 3.5–5.0)
Alkaline Phosphatase: 166 U/L — ABNORMAL HIGH (ref 38–126)
Anion gap: 18 — ABNORMAL HIGH (ref 5–15)
BUN: 11 mg/dL (ref 6–20)
CO2: 13 mmol/L — ABNORMAL LOW (ref 22–32)
Calcium: 9.2 mg/dL (ref 8.9–10.3)
Chloride: 107 mmol/L (ref 98–111)
Creatinine, Ser: 1.12 mg/dL — ABNORMAL HIGH (ref 0.44–1.00)
GFR calc Af Amer: >60 mL/min (ref >60)
GFR calc non Af Amer: >60 mL/min (ref >60)
Glucose, Bld: 106 mg/dL — ABNORMAL HIGH (ref 70–99)
Potassium: 4.3 mmol/L (ref 3.5–5.1)
Sodium: 138 mmol/L (ref 135–145)
Total Bilirubin: 0.3 mg/dL (ref 0.3–1.2)
Total Protein: 6 g/dL — ABNORMAL LOW (ref 6.5–8.1)

## 2019-01-17 LAB — CBC
HCT: 41.5 % (ref 36.0–46.0)
Hemoglobin: 13.7 g/dL (ref 12.0–15.0)
MCH: 30.4 pg (ref 26.0–34.0)
MCHC: 33 g/dL (ref 30.0–36.0)
MCV: 92.2 fL (ref 80.0–100.0)
Platelets: 114 10*3/uL — ABNORMAL LOW (ref 150–400)
RBC: 4.5 MIL/uL (ref 3.87–5.11)
RDW: 13.4 % (ref 11.5–15.5)
WBC: 6.3 10*3/uL (ref 4.0–10.5)
nRBC: 0 % (ref 0.0–0.2)

## 2019-01-17 LAB — ABO/RH: ABO/RH(D): O POS

## 2019-01-17 LAB — TYPE AND SCREEN
ABO/RH(D): O POS
Antibody Screen: NEGATIVE

## 2019-01-17 LAB — RESPIRATORY PANEL BY RT PCR (FLU A&B, COVID)
Influenza A by PCR: NEGATIVE
Influenza B by PCR: NEGATIVE
SARS Coronavirus 2 by RT PCR: NEGATIVE

## 2019-01-17 LAB — URIC ACID: Uric Acid, Serum: 6.5 mg/dL (ref 2.5–7.1)

## 2019-01-17 MED ORDER — PENICILLIN G POT IN DEXTROSE 60000 UNIT/ML IV SOLN: 3.0000 10*6.[IU] | INTRAVENOUS | Status: DC

## 2019-01-17 MED ORDER — SODIUM CHLORIDE (PF) 0.9 % IJ SOLN
INTRAMUSCULAR | Status: DC | PRN
  Administered 2019-01-17: 12 mL/h via EPIDURAL

## 2019-01-17 MED ORDER — EPHEDRINE 5 MG/ML INJ: 10.0000 mg | INTRAVENOUS | Status: DC | PRN

## 2019-01-17 MED ORDER — FLEET ENEMA 7-19 GM/118ML RE ENEM: 1.0000 | ENEMA | Freq: Every day | RECTAL | Status: DC | PRN

## 2019-01-17 MED ORDER — SIMETHICONE 80 MG PO CHEW: 80.0000 mg | CHEWABLE_TABLET | ORAL | Status: DC | PRN

## 2019-01-17 MED ORDER — PRENATAL MULTIVITAMIN CH
1.0000 | ORAL_TABLET | Freq: Every day | ORAL | Status: DC
  Administered 2019-01-18 – 2019-01-19 (×2): 1 via ORAL
  Filled 2019-01-17 (×2): qty 1

## 2019-01-17 MED ORDER — LACTATED RINGERS IV SOLN: 500.0000 mL | Freq: Once | INTRAVENOUS | Status: DC

## 2019-01-17 MED ORDER — LACTATED RINGERS AMNIOINFUSION: INTRAVENOUS | Status: DC

## 2019-01-17 MED ORDER — ZOLPIDEM TARTRATE 5 MG PO TABS: 5.0000 mg | ORAL_TABLET | Freq: Every evening | ORAL | Status: DC | PRN

## 2019-01-17 MED ORDER — PHENYLEPHRINE 40 MCG/ML (10ML) SYRINGE FOR IV PUSH (FOR BLOOD PRESSURE SUPPORT): 80.0000 ug | PREFILLED_SYRINGE | INTRAVENOUS | Status: DC | PRN

## 2019-01-17 MED ORDER — LACTATED RINGERS IV SOLN: INTRAVENOUS | Status: DC

## 2019-01-17 MED ORDER — FERROUS SULFATE 325 (65 FE) MG PO TABS
325.0000 mg | ORAL_TABLET | Freq: Two times a day (BID) | ORAL | Status: DC
  Administered 2019-01-18 – 2019-01-19 (×3): 325 mg via ORAL
  Filled 2019-01-17 (×3): qty 1

## 2019-01-17 MED ORDER — LACTATED RINGERS IV SOLN: 500.0000 mL | INTRAVENOUS | Status: DC | PRN

## 2019-01-17 MED ORDER — WITCH HAZEL-GLYCERIN EX PADS: 1.0000 "application " | MEDICATED_PAD | CUTANEOUS | Status: DC | PRN

## 2019-01-17 MED ORDER — SOD CITRATE-CITRIC ACID 500-334 MG/5ML PO SOLN: 30.0000 mL | ORAL | Status: DC | PRN

## 2019-01-17 MED ORDER — ACETAMINOPHEN 325 MG PO TABS: 650.0000 mg | ORAL_TABLET | ORAL | Status: DC | PRN

## 2019-01-17 MED ORDER — IBUPROFEN 600 MG PO TABS
600.0000 mg | ORAL_TABLET | Freq: Four times a day (QID) | ORAL | Status: DC
  Administered 2019-01-17 – 2019-01-19 (×8): 600 mg via ORAL
  Filled 2019-01-17 (×8): qty 1

## 2019-01-17 MED ORDER — OXYCODONE-ACETAMINOPHEN 5-325 MG PO TABS: 1.0000 | ORAL_TABLET | ORAL | Status: DC | PRN

## 2019-01-17 MED ORDER — ONDANSETRON HCL 4 MG/2ML IJ SOLN: 4.0000 mg | Freq: Four times a day (QID) | INTRAMUSCULAR | Status: DC | PRN

## 2019-01-17 MED ORDER — COCONUT OIL OIL
1.0000 "application " | TOPICAL_OIL | Status: DC | PRN
  Administered 2019-01-17: 1 via TOPICAL

## 2019-01-17 MED ORDER — DIPHENHYDRAMINE HCL 50 MG/ML IJ SOLN: 12.5000 mg | INTRAMUSCULAR | Status: DC | PRN

## 2019-01-17 MED ORDER — BENZOCAINE-MENTHOL 20-0.5 % EX AERO
1.0000 "application " | INHALATION_SPRAY | CUTANEOUS | Status: DC | PRN
  Administered 2019-01-17: 1 via TOPICAL
  Filled 2019-01-17: qty 56

## 2019-01-17 MED ORDER — SODIUM CHLORIDE 0.9 % IV SOLN
5.0000 10*6.[IU] | Freq: Once | INTRAVENOUS | Status: AC
  Administered 2019-01-17: 5 10*6.[IU] via INTRAVENOUS
  Filled 2019-01-17: qty 5

## 2019-01-17 MED ORDER — FENTANYL-BUPIVACAINE-NACL 0.5-0.125-0.9 MG/250ML-% EP SOLN
EPIDURAL | Status: AC
  Filled 2019-01-17: qty 250

## 2019-01-17 MED ORDER — DIBUCAINE (PERIANAL) 1 % EX OINT: 1.0000 "application " | TOPICAL_OINTMENT | CUTANEOUS | Status: DC | PRN

## 2019-01-17 MED ORDER — OXYTOCIN BOLUS FROM INFUSION
500.0000 mL | Freq: Once | INTRAVENOUS | Status: AC
  Administered 2019-01-17: 14:00:00 500 mL via INTRAVENOUS

## 2019-01-17 MED ORDER — LIDOCAINE HCL (PF) 1 % IJ SOLN
INTRAMUSCULAR | Status: DC | PRN
  Administered 2019-01-17 (×2): 4 mL via EPIDURAL

## 2019-01-17 MED ORDER — ONDANSETRON HCL 4 MG/2ML IJ SOLN: 4.0000 mg | INTRAMUSCULAR | Status: DC | PRN

## 2019-01-17 MED ORDER — FENTANYL-BUPIVACAINE-NACL 0.5-0.125-0.9 MG/250ML-% EP SOLN: 12.0000 mL/h | EPIDURAL | Status: DC | PRN

## 2019-01-17 MED ORDER — DIPHENHYDRAMINE HCL 25 MG PO CAPS
25.0000 mg | ORAL_CAPSULE | Freq: Four times a day (QID) | ORAL | Status: DC | PRN
  Filled 2019-01-17: qty 1

## 2019-01-17 MED ORDER — SENNOSIDES-DOCUSATE SODIUM 8.6-50 MG PO TABS
2.0000 | ORAL_TABLET | ORAL | Status: DC
  Administered 2019-01-17: 2 via ORAL
  Filled 2019-01-17: qty 2

## 2019-01-17 MED ORDER — ONDANSETRON HCL 4 MG PO TABS: 4.0000 mg | ORAL_TABLET | ORAL | Status: DC | PRN

## 2019-01-17 MED ORDER — OXYTOCIN 40 UNITS IN NORMAL SALINE INFUSION - SIMPLE MED
2.5000 [IU]/h | INTRAVENOUS | Status: DC
  Filled 2019-01-17: qty 1000

## 2019-01-17 MED ORDER — OXYCODONE-ACETAMINOPHEN 5-325 MG PO TABS: 2.0000 | ORAL_TABLET | ORAL | Status: DC | PRN

## 2019-01-17 MED ORDER — PHENYLEPHRINE 40 MCG/ML (10ML) SYRINGE FOR IV PUSH (FOR BLOOD PRESSURE SUPPORT)
PREFILLED_SYRINGE | INTRAVENOUS | Status: AC
  Filled 2019-01-17: qty 10

## 2019-01-17 MED ORDER — LIDOCAINE HCL (PF) 1 % IJ SOLN: 30.0000 mL | INTRAMUSCULAR | Status: DC | PRN

## 2019-01-17 NOTE — Anesthesia Procedure Notes (Signed)
Epidural Patient location during procedure: OB Start time: 01/17/2019 12:27 PM End time: 01/17/2019 12:30 PM  Staffing Anesthesiologist: Kaylyn Layer, MD Performed: anesthesiologist   Preanesthetic Checklist Completed: patient identified, IV checked, risks and benefits discussed, monitors and equipment checked, pre-op evaluation and timeout performed  Epidural Patient position: sitting Prep: DuraPrep and site prepped and draped Patient monitoring: continuous pulse ox, blood pressure and heart rate Approach: midline Location: L3-L4 Injection technique: LOR air  Needle:  Needle type: Tuohy  Needle gauge: 17 G Needle length: 9 cm Needle insertion depth: 6 cm Catheter type: closed end flexible Catheter size: 19 Gauge Catheter at skin depth: 11 cm Test dose: negative and Other (1% lidocaine)  Assessment Events: blood not aspirated, injection not painful, no injection resistance, no paresthesia and negative IV test  Additional Notes Patient identified. Risks, benefits, and alternatives discussed with patient including but not limited to bleeding, infection, nerve damage, paralysis, failed block, incomplete pain control, headache, blood pressure changes, nausea, vomiting, reactions to medication, itching, and postpartum back pain. Confirmed with bedside nurse the patient's most recent platelet count. Confirmed with patient that they are not currently taking any anticoagulation, have any bleeding history, or any family history of bleeding disorders. Patient expressed understanding and wished to proceed. All questions were answered. Sterile technique was used throughout the entire procedure. Please see nursing notes for vital signs.   Crisp LOR on first pass. Test dose was given through epidural catheter and negative prior to continuing to dose epidural or start infusion. Warning signs of high block given to the patient including shortness of breath, tingling/numbness in hands, complete  motor block, or any concerning symptoms with instructions to call for help. Patient was given instructions on fall risk and not to get out of bed. All questions and concerns addressed with instructions to call with any issues or inadequate analgesia.  Reason for block:procedure for pain

## 2019-01-17 NOTE — MAU Note (Signed)
Pt is G2P1 at [redacted]w[redacted]d who presents with contractions that are 2-3 mins apart and light pink bleeding. Denies LOF. Unsure of FM due to pain. Has had cramping since last night, but woke up to contractions.

## 2019-01-17 NOTE — Progress Notes (Signed)
C/o vaginal pressure  SROM mod meconium fluid IUPC/ISE placed Amnioinfusion VE 9/LOT+1 station  Tracing" baseline 125 (+) accels  (+) early with variable deceleration Ctx q 2-3 mins CBC Latest Ref Rng & Units 01/17/2019 08/31/2014 08/30/2014  WBC 4.0 - 10.5 K/uL 6.3 16.5(H) 17.2(H)  Hemoglobin 12.0 - 15.0 g/dL 13.2 4.4(W) 1.0(U)  Hematocrit 36.0 - 46.0 % 41.5 28.5(L) 25.6(L)  Platelets 150 - 400 K/uL 114(L) 137(L) 101(L)   CBG (last 3)  Recent Labs    01/17/19 1301 01/17/19 1336  GLUCAP 45* 69*   IMP: active phase GBS cx (+) on IV PCN Class A1 GDM Hx HELLP syndrome AMA Gestational thrombocytopenia r/o hellp P) right exaggerated sims. Amnioinfusion.  Add CMP/uric acid

## 2019-01-17 NOTE — Anesthesia Preprocedure Evaluation (Signed)
Anesthesia Evaluation  Patient identified by MRN, date of birth, ID band Patient awake    Reviewed: Allergy & Precautions, Patient's Chart, lab work & pertinent test results  History of Anesthesia Complications Negative for: history of anesthetic complications  Airway Mallampati: II  TM Distance: >3 FB Neck ROM: Full    Dental no notable dental hx.    Pulmonary neg pulmonary ROS,    Pulmonary exam normal        Cardiovascular negative cardio ROS Normal cardiovascular exam     Neuro/Psych negative neurological ROS  negative psych ROS   GI/Hepatic negative GI ROS, Neg liver ROS,   Endo/Other  diabetes, Gestational  Renal/GU negative Renal ROS  negative genitourinary   Musculoskeletal negative musculoskeletal ROS (+)   Abdominal   Peds  Hematology negative hematology ROS (+)   Anesthesia Other Findings Day of surgery medications reviewed with patient.  Reproductive/Obstetrics (+) Pregnancy (Hx of preE/HELLP with prior pregnancy)                             Anesthesia Physical Anesthesia Plan  ASA: II  Anesthesia Plan: Epidural   Post-op Pain Management:    Induction:   PONV Risk Score and Plan: Treatment may vary due to age or medical condition  Airway Management Planned: Natural Airway  Additional Equipment:   Intra-op Plan:   Post-operative Plan:   Informed Consent: I have reviewed the patients History and Physical, chart, labs and discussed the procedure including the risks, benefits and alternatives for the proposed anesthesia with the patient or authorized representative who has indicated his/her understanding and acceptance.       Plan Discussed with:   Anesthesia Plan Comments:         Anesthesia Quick Evaluation

## 2019-01-17 NOTE — Lactation Note (Signed)
This note was copied from a baby's chart. Lactation Consultation Note  Patient Name: Kendra Sparks [id number] Date: 01/17/2019 Reason for consult: Initial assessment;Early term 37-38.6wks  I conducted an initial consult with Ms. [name], a P2 who has experience breast feeding her first child, now 4. She has a motif luna on order from her insurance. She had positive breast changes in pregnancy, and she belongs to a breast feeding support group. She is very motivated to breast feed.  She states that baby [name] has latched since delivery, but she is still working on establishing good position and depth at the breast. She fed [name] prior to my entry. He was in the bassinet. I offered to help with practicing positioning at the breast and she agreed.  I changed a wet diaper, and then we reviewed hand expression. Ms. Simerson hand expressed colostrum, and then we placed him in football hold on the right breast. Her nipples are everted and WNL. He was too sleepy to latch, but we used this opportunity to discuss position and how to stimulate baby to latch.   I reviewed breast feeding basics such as day 1 feeding patterns, output expectations,a and feeding cues.  I recommended breast feeding on demand 8-12 times a day and waking to feed baby as needed. I gave her a breast feeding brochure and reviewed our resources.  Maternal Data Formula Feeding for Exclusion: No Has patient been taught Hand Expression?: Yes Does the patient have breastfeeding experience prior to this delivery?: Yes  Feeding Feeding Type: Breast Fed  LATCH Score Latch: Too sleepy or reluctant, no latch achieved, no sucking elicited.          Interventions Interventions: Breast feeding basics reviewed;Assisted with latch;Skin to skin;Breast massage;Hand express;Support pillows;Adjust position  Lactation Tools Discussed/Used Tools: Other (comment)(spoon)   Consult Status Consult Status: Follow-up Date:  01/18/19 Follow-up type: In-patient    Walker Shadow 01/17/2019, 11:51 PM

## 2019-01-17 NOTE — Progress Notes (Signed)
Reviewed labs:   CBC Latest Ref Rng & Units 01/17/2019 08/31/2014 08/30/2014  WBC 4.0 - 10.5 K/uL 6.3 16.5(H) 17.2(H)  Hemoglobin 12.0 - 15.0 g/dL 12.2 5.8(T) 4.6(I)  Hematocrit 36.0 - 46.0 % 41.5 28.5(L) 25.6(L)  Platelets 150 - 400 K/uL 114(L) 137(L) 101(L)   CMP Latest Ref Rng & Units 01/17/2019 11/23/2017 10/17/2016  Glucose 70 - 99 mg/dL 194(F) 84 90  BUN 6 - 20 mg/dL 11 18 15   Creatinine 0.44 - 1.00 mg/dL ) 1.25(I 7.12  Sodium 135 - 145 mmol/L 138 141 139  Potassium 3.5 - 5.1 mmol/L 4.3 4.1 5.5(H)  Chloride 98 - 111 mmol/L 107 104 104  CO2 22 - 32 mmol/L 13(L) 30 28  Calcium 8.9 - 10.3 mg/dL 9.2 9.7 9.8  Total Protein 6.5 - 8.1 g/dL 6.0(L) - 7.6  Total Bilirubin 0.3 - 1.2 mg/dL 0.3 - 0.9  Alkaline Phos 38 - 126 U/L 166(H) - 84  AST 15 - 41 U/L 30 - 12  ALT 0 - 44 U/L 28 - 8  uric acid 6.5   No evidence of Hellp syndrome  plt ct at this pt c/w gestational thrombocytopenia. Will recheck labs in am

## 2019-01-17 NOTE — H&P (Signed)
Jamya Monette Maryalyce Sanjuan is a 39 y.o. female presenting @ 38 wk in labor (+) GBS/ Class A1 GDM Hx HELLP syndrome  OB History    Gravida  2   Para  1   Term  1   Preterm      AB      Living  1     SAB      TAB      Ectopic      Multiple  0   Live Births  1          Past Medical History:  Diagnosis Date  . Anal fissure   . GDM (gestational diabetes mellitus)   . Gestational diabetes    diet controlled  . Hx of condyloma acuminatum   . Hx of varicella   . Vaginal Pap smear, abnormal   . Vitamin D deficiency    Past Surgical History:  Procedure Laterality Date  . NO PAST SURGERIES     Family History: family history includes Cancer in her mother; Diabetes in her maternal grandmother; Heart attack in her maternal grandfather; Hypertension in her maternal grandmother and mother. Social History:  reports that she has never smoked. She has never used smokeless tobacco. She reports previous alcohol use. She reports that she does not use drugs.     Maternal Diabetes: Yes:  Diabetes Type:  Diet controlled Genetic Screening: Normal Maternal Ultrasounds/Referrals: Normal Fetal Ultrasounds or other Referrals:  None Maternal Substance Abuse:  No Significant Maternal Medications:  None Significant Maternal Lab Results:  Group B Strep positive Other Comments:  AMA. hx HELLP  Review of Systems History Dilation: 9 Effacement (%): 100 Station: 0 Exam by:: C. WOods, RN Blood pressure 139/81, pulse 74, temperature 97.6 F (36.4 C), temperature source Oral, resp. rate 17, height 5\' 1"  (1.549 m), weight 71 kg, last menstrual period 05/18/2018, SpO2 100 %. Exam Physical Exam  Constitutional: She is oriented to person, place, and time. She appears well-developed and well-nourished.  HENT:  Head: Atraumatic.  Eyes: EOM are normal.  Cardiovascular: Regular rhythm.  Respiratory: Breath sounds normal.  GI: Soft.  Musculoskeletal:     Cervical back: Neck supple.   Neurological: She is alert and oriented to person, place, and time.  Skin: Skin is warm and dry.  Psychiatric: She has a normal mood and affect.    Prenatal labs: ABO, Rh: --/--/O POS, O POS Performed at [facility], [address], [place], [place]  [id number]) Antibody: NEG (01/04 1155) Rubella: Immune (07/10 0000) RPR: Nonreactive (07/10 0000)  HBsAg: Negative (07/10 0000)  HIV: Non-reactive (07/10 0000)  GBS: Positive/-- (12/23 0000)   Assessment/Plan: Active phase Class A1 GDM Hx HELLP Term  GBS cx (+) P) admit routine labs. epidural  PCN IV. amniotomy prn. ISE. IUPC CBG q 4 hrs Aerial Dilley A Lorrayne Ismael 01/17/2019, 2:07 PM

## 2019-01-18 LAB — COMPREHENSIVE METABOLIC PANEL
ALT: 25 U/L (ref 0–44)
AST: 25 U/L (ref 15–41)
Albumin: 2.4 g/dL — ABNORMAL LOW (ref 3.5–5.0)
Alkaline Phosphatase: 135 U/L — ABNORMAL HIGH (ref 38–126)
Anion gap: 9 (ref 5–15)
BUN: 7 mg/dL (ref 6–20)
CO2: 20 mmol/L — ABNORMAL LOW (ref 22–32)
Calcium: 9 mg/dL (ref 8.9–10.3)
Chloride: 111 mmol/L (ref 98–111)
Creatinine, Ser: 0.98 mg/dL (ref 0.44–1.00)
GFR calc Af Amer: >60 mL/min (ref >60)
GFR calc non Af Amer: >60 mL/min (ref >60)
Glucose, Bld: 110 mg/dL — ABNORMAL HIGH (ref 70–99)
Potassium: 4.1 mmol/L (ref 3.5–5.1)
Sodium: 140 mmol/L (ref 135–145)
Total Bilirubin: 0.5 mg/dL (ref 0.3–1.2)
Total Protein: 5.1 g/dL — ABNORMAL LOW (ref 6.5–8.1)

## 2019-01-18 LAB — CBC
HCT: 34.2 % — ABNORMAL LOW (ref 36.0–46.0)
Hemoglobin: 11.4 g/dL — ABNORMAL LOW (ref 12.0–15.0)
MCH: 30.2 pg (ref 26.0–34.0)
MCHC: 33.3 g/dL (ref 30.0–36.0)
MCV: 90.7 fL (ref 80.0–100.0)
Platelets: 102 10*3/uL — ABNORMAL LOW (ref 150–400)
RBC: 3.77 MIL/uL — ABNORMAL LOW (ref 3.87–5.11)
RDW: 13.3 % (ref 11.5–15.5)
WBC: 13.3 10*3/uL — ABNORMAL HIGH (ref 4.0–10.5)
nRBC: 0 % (ref 0.0–0.2)

## 2019-01-18 LAB — RPR: RPR Ser Ql: NONREACTIVE

## 2019-01-18 NOTE — Anesthesia Postprocedure Evaluation (Signed)
Anesthesia Post Note  Patient: [name]  Procedure(s) Performed: AN AD HOC LABOR EPIDURAL     Patient location during evaluation: Mother Baby Anesthesia Type: Epidural Level of consciousness: awake and alert and oriented Pain management: satisfactory to patient Vital Signs Assessment: post-procedure vital signs reviewed and stable Respiratory status: spontaneous breathing and nonlabored ventilation Cardiovascular status: stable Postop Assessment: no headache, no backache, no signs of nausea or vomiting, adequate PO intake, patient able to bend at knees and able to ambulate (patient up walking) Anesthetic complications: no    Last Vitals:  Vitals:   01/18/19 0445 01/18/19 0750  BP: (!) 101/52 (!) 104/58  Pulse: 67 62  Resp:  17  Temp: 36.7 C 36.7 C  SpO2:  100%    Last Pain:  Vitals:   01/18/19 0834  TempSrc:   PainSc: 0-No pain   Pain Goal:                   Kendra Sparks

## 2019-01-18 NOTE — Lactation Note (Signed)
This note was copied from a baby's chart. Lactation Consultation Note  Patient Name: Kendra Sparks Date: 01/18/2019 Reason for consult: Follow-up assessment  P2 mother whose infant is now 82 hours old.  Mother breast fed her first child (now 37 years old) for 5 months.  Baby was asleep in mother's arms when I arrived.  She was a little bit concerned that he has not latched very well since delivery.  I reassured her that he is just now 31 hours old and he will begin to awaken and feed better as the day progresses.  He will probably begin to awaken more tonight.  Suggested mother call for latch assistance with the next feeding.  Mother verbalized understanding.  She has been able to perform hand expression and has no other questions/concerns at this time.  Mother has a DEBP on order from her insurance company.  Father present.   Maternal Data    Feeding Feeding Type: Breast Fed  LATCH Score                   Interventions    Lactation Tools Discussed/Used     Consult Status Consult Status: Follow-up Date: 01/19/19 Follow-up type: In-patient    Fatih Stalvey R Laityn Bensen 01/18/2019, 3:06 PM

## 2019-01-18 NOTE — Progress Notes (Signed)
PPD1 SVD:   S:  Pt reports feeling well/ Tolerating po/ Voiding without problems/ No n/v/ Bleeding is moderate/ Pain controlled withprescription NSAID's including ibuprofen (Motrin)  Newborn info live female BRF   O:  A & O x 3 / VS: Blood pressure (!) 104/58, pulse 62, temperature 98.1 F (36.7 C), temperature source Oral, resp. rate 17, height 5\' 1"  (1.549 m), weight 71 kg, last menstrual period 05/18/2018, SpO2 100 %, unknown if currently breastfeeding.  LABS:  Results for orders placed or performed during the hospital encounter of 01/17/19 (from the past 24 hour(s))  Glucose, capillary     Status: Abnormal   Collection Time: 01/17/19  1:36 PM  Result Value Ref Range   Glucose-Capillary 69 (L) 70 - 99 mg/dL  Respiratory Panel by RT PCR (Flu A&B, Covid) - Nasopharyngeal Swab     Status: None   Collection Time: 01/17/19  2:28 PM   Specimen: Nasopharyngeal Swab  Result Value Ref Range   SARS Coronavirus 2 by RT PCR NEGATIVE NEGATIVE   Influenza A by PCR NEGATIVE NEGATIVE   Influenza B by PCR NEGATIVE NEGATIVE  Comprehensive metabolic panel     Status: Abnormal   Collection Time: 01/17/19  2:28 PM  Result Value Ref Range   Sodium 138 135 - 145 mmol/L   Potassium 4.3 3.5 - 5.1 mmol/L   Chloride 107 98 - 111 mmol/L   CO2 13 (L) 22 - 32 mmol/L   Glucose, Bld 106 (H) 70 - 99 mg/dL   BUN 11 6 - 20 mg/dL   Creatinine, Ser 1.12 (H) 0.44 - 1.00 mg/dL   Calcium 9.2 8.9 - 10.3 mg/dL   Total Protein 6.0 (L) 6.5 - 8.1 g/dL   Albumin 3.0 (L) 3.5 - 5.0 g/dL   AST 30 15 - 41 U/L   ALT 28 0 - 44 U/L   Alkaline Phosphatase 166 (H) 38 - 126 U/L   Total Bilirubin 0.3 0.3 - 1.2 mg/dL   GFR calc non Af Amer >60 >60 mL/min   GFR calc Af Amer >60 >60 mL/min   Anion gap 18 (H) 5 - 15  Uric acid     Status: None   Collection Time: 01/17/19  2:28 PM  Result Value Ref Range   Uric Acid, Serum 6.5 2.5 - 7.1 mg/dL  CBC     Status: Abnormal   Collection Time: 01/18/19  3:35 AM  Result Value Ref Range    WBC 13.3 (H) 4.0 - 10.5 K/uL   RBC 3.77 (L) 3.87 - 5.11 MIL/uL   Hemoglobin 11.4 (L) 12.0 - 15.0 g/dL   HCT 34.2 (L) 36.0 - 46.0 %   MCV 90.7 80.0 - 100.0 fL   MCH 30.2 26.0 - 34.0 pg   MCHC 33.3 30.0 - 36.0 g/dL   RDW 13.3 11.5 - 15.5 %   Platelets 102 (L) 150 - 400 K/uL   nRBC 0.0 0.0 - 0.2 %  Comprehensive metabolic panel     Status: Abnormal   Collection Time: 01/18/19  3:35 AM  Result Value Ref Range   Sodium 140 135 - 145 mmol/L   Potassium 4.1 3.5 - 5.1 mmol/L   Chloride 111 98 - 111 mmol/L   CO2 20 (L) 22 - 32 mmol/L   Glucose, Bld 110 (H) 70 - 99 mg/dL   BUN 7 6 - 20 mg/dL   Creatinine, Ser 0.98 0.44 - 1.00 mg/dL   Calcium 9.0 8.9 - 10.3 mg/dL   Total  Protein 5.1 (L) 6.5 - 8.1 g/dL   Albumin 2.4 (L) 3.5 - 5.0 g/dL   AST 25 15 - 41 U/L   ALT 25 0 - 44 U/L   Alkaline Phosphatase 135 (H) 38 - 126 U/L   Total Bilirubin 0.5 0.3 - 1.2 mg/dL   GFR calc non Af Amer >60 >60 mL/min   GFR calc Af Amer >60 >60 mL/min   Anion gap 9 5 - 15    I&O: I/O last 3 completed shifts: In: -  Out: 547 [Urine:350; Blood:197]   No intake/output data recorded.  Lungs: Heart exam - S1, S2 normal, no murmur, no gallop, rate regular  Heart: regular rate and rhythm, S1, S2 normal, no murmur, click, rub or gallop  Abdomen: soft uterus at umb firm  Perineum: is normal  Lochia: mod  Extremities:edema tr+    A/P: PPD # 1/ [id number] Gestational thrombocytopenia Class A1 GDM diet controlled  Doing well  Continue routine post partum orders  Anticipate d/c am

## 2019-01-19 MED ORDER — IBUPROFEN 600 MG PO TABS: 600.0000 mg | ORAL_TABLET | Freq: Four times a day (QID) | ORAL | 0 refills | Status: DC

## 2019-01-19 NOTE — Lactation Note (Signed)
This note was copied from a baby's chart. Lactation Consultation Note  Patient Name: Boy Aloria Looper PULGS'P Date: 01/19/2019 Reason for consult: Follow-up assessment;Early term 37-38.6wks;Maternal endocrine disorder Type of Endocrine Disorder?: Diabetes  Visited with P2 Mom of ET infant at 20 hrs old.  Baby at 4% weight loss.  Mom has been supplementing with formula during night.   Mom states baby having trouble opening his mouth wide enough.    Reviewed basics of a good latch.  Baby getting circumcised currently, encouraged Mom to place baby STS on her chest when he returns.  Encouraged her to ask for help with positioning and latching when he starts cueing he is hungry.    Consult Status Consult Status: Follow-up Date: 01/19/19 Follow-up type: Call as needed    Judee Clara 01/19/2019, 8:39 AM

## 2019-01-19 NOTE — Progress Notes (Signed)
PPD #2, VAVD, baby boy "[name]"  S:  Reports feeling well, c/o some cramping with breastfeeding. Ready to go home today   Denies HA, visual changes, RUQ/epigastric pain              Tolerating po/ No nausea or vomiting / Denies dizziness or SOB             Bleeding is getting lighter             Pain controlled with Motrin             Up ad lib / ambulatory / voiding QS  Newborn breast with formula feeding - working on latch, but feels like it is going better; seeing colostrum   / Circumcision - completed today   O:               VS: BP 111/72 (BP Location: Right Arm)   Pulse 65   Temp 97.9 F (36.6 C) (Oral)   Resp 16   Ht 5\' 1"  (1.549 m)   Wt 71 kg   LMP 05/18/2018   SpO2 100%   Breastfeeding Unknown   BMI 29.59 kg/m    LABS:              Recent Labs    01/17/19 1134 01/18/19 0335  WBC 6.3 13.3*  HGB 13.7 11.4*  PLT 114* 102*               Blood type: --/--/O POS, O POS Performed at [facility], [address] [place], [place]  [phone])  Rubella: Immune (07/10 0000)                     I&O: Intake/Output      01/05 0701 - 01/06 0700 01/06 0701 - 01/07 0700   I.V. (mL/kg) 0 (0)    Other 0    Total Intake(mL/kg) 0 (0)    Urine (mL/kg/hr)     Blood     Total Output     Net 0                       Physical Exam:             Alert and oriented X3  Lungs: Clear and unlabored  Heart: regular rate and rhythm / no murmurs  Abdomen: soft, non-tender, non-distended              Fundus: firm, non-tender, U-1  Perineum: intact, no edema  Lochia: scant rubra on pad   Extremities: no edema, no calf pain or tenderness    A/P: PPD # 2, VAVD  A1GDM   - f/u PP  Gestational thrombocytopenia   Mild ABL Anemia   - on oral FE  Doing well - stable status  Routine post partum orders  Discharge home today  WOB discharge book given, instructions and warning s/s reviewed  F/u with Dr. 03/07 in 6 weeks   Cherly Hensen, MSN, CNM [facility]

## 2019-01-19 NOTE — Discharge Summary (Addendum)
Obstetric Discharge Summary   Patient Name: Kendra Sparks DOB: 01/13/1981 MRN: [id number]  Date of Admission: 01/17/2019 Date of Discharge: 01/19/2019 Date of Delivery: 01/17/2019 Gestational Age at Delivery: [redacted]w[redacted]d  Primary OB: [facility] - Dr. Cherly Hensen  Antepartum complications:  - AMA - Hx. Of HELLP syndrome - A1GDM - GBS positive  Prenatal Labs:  ABO, Rh: --/--/O POS, O POS Performed at [facility], [address] [place], [place] [address]  [phone]) Antibody: NEG (01/04 1155) Rubella: Immune (07/10 0000) RPR: Nonreactive (07/10 0000)  HBsAg: Negative (07/10 0000)  HIV: Non-reactive (07/10 0000)  GBS: Positive/-- (12/23 0000)  Admitting Diagnosis: Active labor at term   Secondary Diagnoses: Patient Active Problem List   Diagnosis Date Noted   Normal labor and delivery 01/17/2019   GDM, class A1    Gestational thrombocytopenia 01/17/2019                             Augmentation: AROM Complications: None  Date of Delivery: 01/17/2019 Delivered By: Dr. Cherly Hensen Delivery Type: vacuum-assisted vaginal delivery for fetal decelerations Anesthesia: epidural Placenta: spontaneous Laceration: none Episiotomy: none  Newborn Data: Live born female  Birth Weight: 7 lb 1.2 oz (3210 g) APGAR: 9, 9  Newborn Delivery   Birth date/time: 01/17/2019 14:21:00 Delivery type: Vaginal, Vacuum (Extractor)         Hospital/Postpartum Course  (Vaginal Delivery): Pt. Admitted in active labor at term.  She received IV PCN for GBS prophylaxis and AROM was performed immediately before delivery. She delivered by vacuum-assisted vaginal delivery for fetal heart rate decelerations. See notes, delivery summary and delivery note for details. Patient had an uncomplicated postpartum course.  By time of discharge on PPD#2, her pain was controlled on oral pain medications; she had appropriate lochia and was ambulating, voiding without difficulty and  tolerating regular diet.  She was deemed stable for discharge to home.     Labs: CBC Latest Ref Rng & Units 01/18/2019 01/17/2019 08/31/2014  WBC 4.0 - 10.5 K/uL 13.3(H) 6.3 16.5(H)  Hemoglobin 12.0 - 15.0 g/dL 11.4(L) 13.7 9.6(L)  Hematocrit 36.0 - 46.0 % 34.2(L) 41.5 28.5(L)  Platelets 150 - 400 K/uL 102(L) 114(L) 137(L)   Conflict (See Lab Report): O POS/O POS Performed at [facility], [address], [place], [place] [address]   Physical exam:  BP 111/72 (BP Location: Right Arm)   Pulse 65   Temp 97.9 F (36.6 C) (Oral)   Resp 16   Ht 5\' 1"  (1.549 m)   Wt 71 kg   LMP 05/18/2018   SpO2 100%   Breastfeeding Unknown   BMI 29.59 kg/m  General: alert and no distress Pulm: normal respiratory effort Lochia: appropriate Abdomen: soft, NT Uterine Fundus: firm, below umbilicus Perineum: healing well, no significant erythema, no significant edema Extremities: No evidence of DVT seen on physical exam. No lower extremity edema.   Disposition: stable, discharge to home Baby Feeding: breast milk and formula Baby Disposition: home with mom  Rh Immune globulin given: N/A Rubella vaccine given: N/A Tdap vaccine given in AP or PP setting: UTD 2020 Flu vaccine given in AP or PP setting: not on file   Plan:  Kendra Sparks was discharged to home in good condition. Follow-up appointment at [facility] in 6 weeks.  Discharge Instructions: Per After Visit Summary. Refer to After Visit Summary and [facility] discharge booklet  Activity: Advance as  tolerated. Pelvic rest for 6 weeks.   Diet: Regular, Heart Healthy Discharge Medications: Allergies as of 01/19/2019   No Known Allergies      Medication List     TAKE these medications    ibuprofen 600 MG tablet Commonly known as: ADVIL Take 1 tablet (600 mg total) by mouth every 6 (six) hours.   PRENATAL VITAMIN PO Take by mouth.       Outpatient follow up:  Follow-up Information      Servando Salina, MD. Schedule an appointment as soon as possible for a visit in 6 week(s).   Specialty: Obstetrics and Gynecology Why: Postpartum visit Contact information: [address] [place] [address] [phone]            Signed:  Lars Pinks, MSN, CNM [facility]

## 2019-01-22 ENCOUNTER — Other Ambulatory Visit (HOSPITAL_COMMUNITY): Payer: BC Managed Care – PPO

## 2019-01-24 ENCOUNTER — Inpatient Hospital Stay (HOSPITAL_COMMUNITY)
Admission: AD | Admit: 2019-01-24 | Payer: BC Managed Care – PPO | Source: Home / Self Care | Admitting: Obstetrics and Gynecology

## 2019-01-24 ENCOUNTER — Inpatient Hospital Stay (HOSPITAL_COMMUNITY): Payer: BC Managed Care – PPO

## 2019-03-10 ENCOUNTER — Other Ambulatory Visit: Payer: Self-pay

## 2019-03-10 ENCOUNTER — Ambulatory Visit: Payer: BC Managed Care – PPO | Attending: Family

## 2019-03-10 DIAGNOSIS — Z23 Encounter for immunization: Secondary | ICD-10-CM | POA: Insufficient documentation

## 2019-03-10 NOTE — Progress Notes (Signed)
   Covid-19 Vaccination Clinic  Name:  Kendra Sparks    MRN: [id number] DOB: 02/05/1980  03/10/2019  [name] was observed post Covid-19 immunization for 15 minutes without incidence. She was provided with Vaccine Information Sheet and instruction to access the V-Safe system.   [name] was instructed to call 911 with any severe reactions post vaccine: Marland Kitchen Difficulty breathing  . Swelling of your face and throat  . A fast heartbeat  . A bad rash all over your body  . Dizziness and weakness    Immunizations Administered    Name Date Dose VIS Date Route   Moderna COVID-19 Vaccine 03/10/2019  2:22 PM 0.5 mL 12/14/2018 Intramuscular   Manufacturer: Moderna   Lot: [id number]   NDC: 31427-670-11

## 2019-04-12 ENCOUNTER — Ambulatory Visit: Payer: BC Managed Care – PPO

## 2020-09-06 LAB — OB RESULTS CONSOLE HIV ANTIBODY (ROUTINE TESTING): HIV: NONREACTIVE

## 2020-09-11 LAB — OB RESULTS CONSOLE RUBELLA ANTIBODY, IGM: Rubella: IMMUNE

## 2020-09-11 LAB — OB RESULTS CONSOLE ABO/RH: RH Type: POSITIVE

## 2020-09-11 LAB — OB RESULTS CONSOLE ANTIBODY SCREEN: Antibody Screen: NEGATIVE

## 2020-09-11 LAB — OB RESULTS CONSOLE GC/CHLAMYDIA
Chlamydia: NEGATIVE
Gonorrhea: NEGATIVE

## 2020-09-11 LAB — HEPATITIS C ANTIBODY: HCV Ab: NEGATIVE

## 2020-09-11 LAB — OB RESULTS CONSOLE HEPATITIS B SURFACE ANTIGEN: Hepatitis B Surface Ag: NEGATIVE

## 2020-09-11 LAB — OB RESULTS CONSOLE RPR: RPR: NONREACTIVE

## 2020-09-14 LAB — HM PAP SMEAR

## 2021-01-13 NOTE — L&D Delivery Note (Signed)
Operative Delivery Note ?At 7:39 AM a viable and healthy female was delivered via Vaginal, Vacuum Investment banker, operational).  Presentation: vertex; Position: Occiput,, Posterior; Station: +2. ? ?Verbal consent: obtained from patient.  Risks and benefits discussed in detail.  Risks include, but are not limited to the risks of anesthesia, bleeding, infection, damage to maternal tissues, fetal cephalhematoma.  There is also the risk of inability to effect vaginal delivery of the head, or shoulder dystocia that cannot be resolved by established maneuvers, leading to the need for emergency cesarean section. ?Indication; repetitive deep variables ?APGAR:8 , 9; weight pending .   ?Placenta status: spontaneous intact, .   ?Cord:  marginal with the following complications: .none  Cord pH: n/a ? ?Anesthesia:none   ?Instruments: mushroom vacuum ?Episiotomy: None ?Lacerations: None ?Suture Repair:  n/a ?Est. Blood Loss (mL): 100 ? ?Mom to postpartum.  Baby to Couplet care / Skin to Skin. ? ?Erikson Danzy A Jeovany Huitron ?03/26/2021, 8:04 AM ? ? ? ?

## 2021-03-21 ENCOUNTER — Inpatient Hospital Stay (HOSPITAL_COMMUNITY)
Admission: AD | Admit: 2021-03-21 | Discharge: 2021-03-21 | Disposition: A | Discharge status: Home / Self Care | Payer: BC Managed Care – PPO | Attending: Obstetrics and Gynecology | Admitting: Obstetrics and Gynecology

## 2021-03-21 ENCOUNTER — Encounter (HOSPITAL_COMMUNITY): Payer: Self-pay | Admitting: Obstetrics and Gynecology

## 2021-03-21 ENCOUNTER — Other Ambulatory Visit: Payer: Self-pay

## 2021-03-21 DIAGNOSIS — Z3689 Encounter for other specified antenatal screening: Secondary | ICD-10-CM | POA: Diagnosis not present

## 2021-03-21 DIAGNOSIS — O99113 Other diseases of the blood and blood-forming organs and certain disorders involving the immune mechanism complicating pregnancy, third trimester: Secondary | ICD-10-CM | POA: Diagnosis not present

## 2021-03-21 DIAGNOSIS — Z3A38 38 weeks gestation of pregnancy: Secondary | ICD-10-CM | POA: Diagnosis not present

## 2021-03-21 DIAGNOSIS — Z7982 Long term (current) use of aspirin: Secondary | ICD-10-CM | POA: Insufficient documentation

## 2021-03-21 DIAGNOSIS — O133 Gestational [pregnancy-induced] hypertension without significant proteinuria, third trimester: Secondary | ICD-10-CM | POA: Diagnosis present

## 2021-03-21 DIAGNOSIS — O09523 Supervision of elderly multigravida, third trimester: Secondary | ICD-10-CM | POA: Insufficient documentation

## 2021-03-21 DIAGNOSIS — D696 Thrombocytopenia, unspecified: Secondary | ICD-10-CM | POA: Insufficient documentation

## 2021-03-21 DIAGNOSIS — O09293 Supervision of pregnancy with other poor reproductive or obstetric history, third trimester: Secondary | ICD-10-CM | POA: Diagnosis not present

## 2021-03-21 LAB — COMPREHENSIVE METABOLIC PANEL
ALT: 12 U/L (ref 0–44)
AST: 17 U/L (ref 15–41)
Albumin: 3 g/dL — ABNORMAL LOW (ref 3.5–5.0)
Alkaline Phosphatase: 177 U/L — ABNORMAL HIGH (ref 38–126)
Anion gap: 7 (ref 5–15)
BUN: 10 mg/dL (ref 6–20)
CO2: 24 mmol/L (ref 22–32)
Calcium: 9.2 mg/dL (ref 8.9–10.3)
Chloride: 105 mmol/L (ref 98–111)
Creatinine, Ser: 0.92 mg/dL (ref 0.44–1.00)
GFR, Estimated: >60 mL/min (ref >60)
Glucose, Bld: 80 mg/dL (ref 70–99)
Potassium: 3.8 mmol/L (ref 3.5–5.1)
Sodium: 136 mmol/L (ref 135–145)
Total Bilirubin: 0.9 mg/dL (ref 0.3–1.2)
Total Protein: 6.3 g/dL — ABNORMAL LOW (ref 6.5–8.1)

## 2021-03-21 LAB — PROTEIN / CREATININE RATIO, URINE
Creatinine, Urine: 27.34 mg/dL
Total Protein, Urine: <6 mg/dL

## 2021-03-21 LAB — CBC
HCT: 39.8 % (ref 36.0–46.0)
Hemoglobin: 13.5 g/dL (ref 12.0–15.0)
MCH: 31.2 pg (ref 26.0–34.0)
MCHC: 33.9 g/dL (ref 30.0–36.0)
MCV: 91.9 fL (ref 80.0–100.0)
Platelets: 126 10*3/uL — ABNORMAL LOW (ref 150–400)
RBC: 4.33 MIL/uL (ref 3.87–5.11)
RDW: 13 % (ref 11.5–15.5)
WBC: 6.6 10*3/uL (ref 4.0–10.5)
nRBC: 0 % (ref 0.0–0.2)

## 2021-03-21 NOTE — MAU Note (Signed)
Kendra Sparks is a 42 y.o. at [redacted]w[redacted]d here in MAU reporting: had a few elevated BPs today in Kendra Sparks office.  Hx of HELLP syndrome, so sent in for further eval. First time elevated this preg. Denies HA, visual chages, epigastric pain or increase in swelling. ?Denies pain, bleeding or leaking. Reports +FM. ?Onset of complaint: today ?Pain score: 0/10 ?Vitals:  ? 03/21/21 1715  ?BP: (!) 145/83  ?Pulse: 75  ?Resp: 17  ?Temp: 98.5 ?F (36.9 ?C)  ?SpO2: 100%  ?   ? ?Lab orders placed from triage:   ?

## 2021-03-21 NOTE — MAU Provider Note (Signed)
History  ? ? ?Cc: elevated BP office ? ?41 yo G3P2 MBF @ 38 wk sent from the office for [facility] labs, serial BP and NST after she presented for routine OB care and found to have mild elevated BP. Denies h/a visual changes or epigastric pain. No leg swelling. Hx preeclampsia on baby ASA ?OB History   ? ? Gravida  ?3  ? Para  ?2  ? Term  ?2  ? Preterm  ?   ? AB  ?   ? Living  ?2  ?  ? ? SAB  ?   ? IAB  ?   ? Ectopic  ?   ? Multiple  ?0  ? Live Births  ?2  ?   ?  ?  ? ? ?Past Medical History:  ?Diagnosis Date  ? Anal fissure   ? Gestational diabetes   ? Hx of condyloma acuminatum   ? Hx of varicella   ? Vaginal Pap smear, abnormal   ? Vitamin D deficiency   ? ? ?Past Surgical History:  ?Procedure Laterality Date  ? NO PAST SURGERIES    ? ? ?Family History  ?Problem Relation Age of Onset  ? Hypertension Mother   ? Cancer Mother   ?     breast  ? Diabetes Maternal Grandmother   ? Hypertension Maternal Grandmother   ? Heart attack Maternal Grandfather   ? ? ?Social History  ? ?Tobacco Use  ? Smoking status: Never  ? Smokeless tobacco: Never  ?Vaping Use  ? Vaping Use: Never used  ?Substance Use Topics  ? Alcohol use: Not Currently  ?  Comment: OCASS  ? Drug use: No  ? ? ?Allergies: No Known Allergies ? ?Medications Prior to Admission  ?Medication Sig Dispense Refill Last Dose  ? aspirin EC 81 MG tablet Take 81 mg by mouth daily. Swallow whole.   03/20/2021  ? Prenatal Vit-Fe Fumarate-FA (PRENATAL VITAMIN PO) Take by mouth.   Past Week  ? ibuprofen (ADVIL) 600 MG tablet Take 1 tablet (600 mg total) by mouth every 6 (six) hours. 30 tablet 0   ? ? ? ?Physical Exam  ? ?Blood pressure 129/79, pulse 68, temperature 98.1 ?F (36.7 ?C), temperature source Oral, resp. rate 16, height 5\' 1"  (1.549 m), weight 70.1 kg, SpO2 99 %, unknown if currently breastfeeding. ? Tracing: baseline 130 (+) accel to 160 good variability ?Ctx q 4 mins ?CBC ? ?   ?Component Value Date/Time  ? WBC 6.6 03/21/2021 1727  ? RBC 4.33 03/21/2021 1727  ? HGB 13.5  03/21/2021 1727  ? HCT 39.8 03/21/2021 1727  ? PLT 126 (L) 03/21/2021 1727  ? MCV 91.9 03/21/2021 1727  ? MCH 31.2 03/21/2021 1727  ? MCHC 33.9 03/21/2021 1727  ? RDW 13.0 03/21/2021 1727  ? ?CMP Latest Ref Rng & Units 03/21/2021 01/18/2019 01/17/2019  ?Glucose 70 - 99 mg/dL 80 03/17/2019) 542(H)  ?BUN 6 - 20 mg/dL 10 7 11   ?Creatinine 0.44 - 1.00 mg/dL 062(B 7.62)  ?Sodium 135 - 145 mmol/L 136 140 138  ?Potassium 3.5 - 5.1 mmol/L 3.8 4.1 4.3  ?Chloride 98 - 111 mmol/L 105 111 107  ?CO2 22 - 32 mmol/L 24 20(L) 13(L)  ?Calcium 8.9 - 10.3 mg/dL 9.2 9.0 9.2  ?Total Protein 6.5 - 8.1 g/dL 6.3(L) 5.1(L) 6.0(L)  ?Total Bilirubin 0.3 - 1.2 mg/dL 0.9 0.5 0.3  ?Alkaline Phos 38 - 126 U/L 177(H) 135(H) 166(H)  ?AST 15 - 41 U/L 17  25 30  ?ALT 0 - 44 U/L 12 25 28   ? ?Urine PCR below detectable range ? ? ?ED Course  ?IMP: transient hypertension in pregnancy ?Gestational thrombocytopenia in pregnancy ?IUP@ 38 wk ?P) d/c home . Preeclampsia warning signs. F/u Monday 3/13 ? ?MDM ? ? ?4/13, MD 6:50 PM 03/21/2021 ? ? ? ?  ?

## 2021-03-22 ENCOUNTER — Telehealth (HOSPITAL_COMMUNITY): Payer: Self-pay | Admitting: *Deleted

## 2021-03-22 ENCOUNTER — Encounter (HOSPITAL_COMMUNITY): Payer: Self-pay | Admitting: *Deleted

## 2021-03-22 NOTE — Telephone Encounter (Signed)
Preadmission screen  

## 2021-03-26 ENCOUNTER — Inpatient Hospital Stay (HOSPITAL_COMMUNITY)
Admission: AD | Admit: 2021-03-26 | Discharge: 2021-03-28 | DRG: 807 | Disposition: A | Discharge status: Home / Self Care | Payer: BC Managed Care – PPO | Attending: Obstetrics and Gynecology | Admitting: Obstetrics and Gynecology

## 2021-03-26 ENCOUNTER — Other Ambulatory Visit: Payer: Self-pay

## 2021-03-26 ENCOUNTER — Encounter (HOSPITAL_COMMUNITY): Payer: Self-pay | Admitting: Obstetrics and Gynecology

## 2021-03-26 DIAGNOSIS — O99824 Streptococcus B carrier state complicating childbirth: Secondary | ICD-10-CM | POA: Diagnosis present

## 2021-03-26 DIAGNOSIS — O43123 Velamentous insertion of umbilical cord, third trimester: Secondary | ICD-10-CM | POA: Diagnosis present

## 2021-03-26 DIAGNOSIS — Z3A38 38 weeks gestation of pregnancy: Secondary | ICD-10-CM | POA: Diagnosis not present

## 2021-03-26 DIAGNOSIS — R001 Bradycardia, unspecified: Secondary | ICD-10-CM | POA: Diagnosis not present

## 2021-03-26 DIAGNOSIS — O1404 Mild to moderate pre-eclampsia, complicating childbirth: Secondary | ICD-10-CM | POA: Diagnosis present

## 2021-03-26 DIAGNOSIS — O9912 Other diseases of the blood and blood-forming organs and certain disorders involving the immune mechanism complicating childbirth: Secondary | ICD-10-CM | POA: Diagnosis present

## 2021-03-26 DIAGNOSIS — Z20822 Contact with and (suspected) exposure to covid-19: Secondary | ICD-10-CM | POA: Diagnosis present

## 2021-03-26 DIAGNOSIS — O99893 Other specified diseases and conditions complicating puerperium: Secondary | ICD-10-CM | POA: Diagnosis not present

## 2021-03-26 DIAGNOSIS — O26893 Other specified pregnancy related conditions, third trimester: Secondary | ICD-10-CM | POA: Diagnosis present

## 2021-03-26 DIAGNOSIS — O134 Gestational [pregnancy-induced] hypertension without significant proteinuria, complicating childbirth: Secondary | ICD-10-CM | POA: Diagnosis present

## 2021-03-26 DIAGNOSIS — D6959 Other secondary thrombocytopenia: Secondary | ICD-10-CM | POA: Diagnosis present

## 2021-03-26 DIAGNOSIS — Z7982 Long term (current) use of aspirin: Secondary | ICD-10-CM

## 2021-03-26 LAB — COMPREHENSIVE METABOLIC PANEL
ALT: 11 U/L (ref 0–44)
AST: 24 U/L (ref 15–41)
Albumin: 3.1 g/dL — ABNORMAL LOW (ref 3.5–5.0)
Alkaline Phosphatase: 167 U/L — ABNORMAL HIGH (ref 38–126)
Anion gap: 9 (ref 5–15)
BUN: 12 mg/dL (ref 6–20)
CO2: 19 mmol/L — ABNORMAL LOW (ref 22–32)
Calcium: 9 mg/dL (ref 8.9–10.3)
Chloride: 110 mmol/L (ref 98–111)
Creatinine, Ser: 0.82 mg/dL (ref 0.44–1.00)
GFR, Estimated: >60 mL/min (ref >60)
Glucose, Bld: 99 mg/dL (ref 70–99)
Potassium: 4.1 mmol/L (ref 3.5–5.1)
Sodium: 138 mmol/L (ref 135–145)
Total Bilirubin: 0.9 mg/dL (ref 0.3–1.2)
Total Protein: 6.2 g/dL — ABNORMAL LOW (ref 6.5–8.1)

## 2021-03-26 LAB — PROTEIN / CREATININE RATIO, URINE
Creatinine, Urine: 98.94 mg/dL
Protein Creatinine Ratio: 1.21 mg/mg{Cre} — ABNORMAL HIGH (ref 0.00–0.15)
Total Protein, Urine: 120 mg/dL

## 2021-03-26 LAB — RPR: RPR Ser Ql: NONREACTIVE

## 2021-03-26 LAB — RAPID HIV SCREEN (HIV 1/2 AB+AG)
HIV 1/2 Antibodies: NONREACTIVE
HIV-1 P24 Antigen - HIV24: NONREACTIVE

## 2021-03-26 LAB — CBC
HCT: 41.5 % (ref 36.0–46.0)
Hemoglobin: 14.1 g/dL (ref 12.0–15.0)
MCH: 31.2 pg (ref 26.0–34.0)
MCHC: 34 g/dL (ref 30.0–36.0)
MCV: 91.8 fL (ref 80.0–100.0)
Platelets: 112 10*3/uL — ABNORMAL LOW (ref 150–400)
RBC: 4.52 MIL/uL (ref 3.87–5.11)
RDW: 13.2 % (ref 11.5–15.5)
WBC: 8.5 10*3/uL (ref 4.0–10.5)
nRBC: 0 % (ref 0.0–0.2)

## 2021-03-26 LAB — RESP PANEL BY RT-PCR (FLU A&B, COVID) ARPGX2
Influenza A by PCR: NEGATIVE
Influenza B by PCR: NEGATIVE
SARS Coronavirus 2 by RT PCR: NEGATIVE

## 2021-03-26 LAB — MAGNESIUM: Magnesium: 4.9 mg/dL — ABNORMAL HIGH (ref 1.7–2.4)

## 2021-03-26 LAB — TYPE AND SCREEN
ABO/RH(D): O POS
Antibody Screen: NEGATIVE

## 2021-03-26 MED ORDER — IBUPROFEN 600 MG PO TABS
600.0000 mg | ORAL_TABLET | Freq: Four times a day (QID) | ORAL | Status: DC
  Administered 2021-03-26 – 2021-03-28 (×8): 600 mg via ORAL
  Filled 2021-03-26 (×8): qty 1

## 2021-03-26 MED ORDER — LIDOCAINE HCL (PF) 1 % IJ SOLN
30.0000 mL | INTRAMUSCULAR | Status: DC | PRN
Start: 2021-03-26 — End: 2021-03-28

## 2021-03-26 MED ORDER — OXYCODONE-ACETAMINOPHEN 5-325 MG PO TABS: 1.0000 | ORAL_TABLET | ORAL | Status: DC | PRN

## 2021-03-26 MED ORDER — SENNOSIDES-DOCUSATE SODIUM 8.6-50 MG PO TABS
2.0000 | ORAL_TABLET | Freq: Every day | ORAL | Status: DC
  Administered 2021-03-27 – 2021-03-28 (×2): 2 via ORAL
  Filled 2021-03-26 (×2): qty 2

## 2021-03-26 MED ORDER — SODIUM CHLORIDE 0.9 % IV SOLN: 1.0000 g | INTRAVENOUS | Status: DC

## 2021-03-26 MED ORDER — DIPHENHYDRAMINE HCL 25 MG PO CAPS: 25.0000 mg | ORAL_CAPSULE | Freq: Four times a day (QID) | ORAL | Status: DC | PRN

## 2021-03-26 MED ORDER — ONDANSETRON HCL 4 MG/2ML IJ SOLN: 4.0000 mg | INTRAMUSCULAR | Status: DC | PRN

## 2021-03-26 MED ORDER — FERROUS SULFATE 325 (65 FE) MG PO TABS
325.0000 mg | ORAL_TABLET | Freq: Two times a day (BID) | ORAL | Status: DC
  Administered 2021-03-26 – 2021-03-28 (×4): 325 mg via ORAL
  Filled 2021-03-26 (×4): qty 1

## 2021-03-26 MED ORDER — MAGNESIUM SULFATE BOLUS VIA INFUSION
4.0000 g | Freq: Once | INTRAVENOUS | Status: AC
  Administered 2021-03-26: 4 g via INTRAVENOUS
  Filled 2021-03-26: qty 1000

## 2021-03-26 MED ORDER — COCONUT OIL OIL
1.0000 "application " | TOPICAL_OIL | Status: DC | PRN
  Administered 2021-03-27: 1 via TOPICAL

## 2021-03-26 MED ORDER — ONDANSETRON HCL 4 MG PO TABS: 4.0000 mg | ORAL_TABLET | ORAL | Status: DC | PRN

## 2021-03-26 MED ORDER — OXYCODONE-ACETAMINOPHEN 5-325 MG PO TABS: 2.0000 | ORAL_TABLET | ORAL | Status: DC | PRN

## 2021-03-26 MED ORDER — DIBUCAINE (PERIANAL) 1 % EX OINT: 1.0000 "application " | TOPICAL_OINTMENT | CUTANEOUS | Status: DC | PRN

## 2021-03-26 MED ORDER — ACETAMINOPHEN 325 MG PO TABS: 650.0000 mg | ORAL_TABLET | ORAL | Status: DC | PRN

## 2021-03-26 MED ORDER — SODIUM CHLORIDE 0.9 % IV SOLN
2.0000 g | Freq: Once | INTRAVENOUS | Status: AC
  Administered 2021-03-26: 2 g via INTRAVENOUS
  Filled 2021-03-26: qty 2000

## 2021-03-26 MED ORDER — LACTATED RINGERS IV SOLN: INTRAVENOUS | Status: DC

## 2021-03-26 MED ORDER — OXYTOCIN-SODIUM CHLORIDE 30-0.9 UT/500ML-% IV SOLN
2.5000 [IU]/h | INTRAVENOUS | Status: DC
  Filled 2021-03-26: qty 500

## 2021-03-26 MED ORDER — OXYCODONE HCL 5 MG PO TABS: 5.0000 mg | ORAL_TABLET | ORAL | Status: DC | PRN

## 2021-03-26 MED ORDER — SOD CITRATE-CITRIC ACID 500-334 MG/5ML PO SOLN: 30.0000 mL | ORAL | Status: DC | PRN

## 2021-03-26 MED ORDER — PRENATAL MULTIVITAMIN CH
1.0000 | ORAL_TABLET | Freq: Every day | ORAL | Status: DC
  Administered 2021-03-26 – 2021-03-27 (×2): 1 via ORAL
  Filled 2021-03-26 (×2): qty 1

## 2021-03-26 MED ORDER — SIMETHICONE 80 MG PO CHEW: 80.0000 mg | CHEWABLE_TABLET | ORAL | Status: DC | PRN

## 2021-03-26 MED ORDER — MAGNESIUM SULFATE 40 GM/1000ML IV SOLN
2.0000 g/h | INTRAVENOUS | Status: DC
  Administered 2021-03-26 – 2021-03-27 (×2): 2 g/h via INTRAVENOUS
  Filled 2021-03-26 (×2): qty 1000

## 2021-03-26 MED ORDER — OXYCODONE HCL 5 MG PO TABS: 10.0000 mg | ORAL_TABLET | ORAL | Status: DC | PRN

## 2021-03-26 MED ORDER — LACTATED RINGERS IV SOLN: 500.0000 mL | INTRAVENOUS | Status: DC | PRN

## 2021-03-26 MED ORDER — ONDANSETRON HCL 4 MG/2ML IJ SOLN: 4.0000 mg | Freq: Four times a day (QID) | INTRAMUSCULAR | Status: DC | PRN

## 2021-03-26 MED ORDER — OXYTOCIN BOLUS FROM INFUSION
333.0000 mL | Freq: Once | INTRAVENOUS | Status: AC
  Administered 2021-03-26: 333 mL via INTRAVENOUS

## 2021-03-26 MED ORDER — BENZOCAINE-MENTHOL 20-0.5 % EX AERO: 1.0000 "application " | INHALATION_SPRAY | CUTANEOUS | Status: DC | PRN

## 2021-03-26 MED ORDER — WITCH HAZEL-GLYCERIN EX PADS: 1.0000 "application " | MEDICATED_PAD | CUTANEOUS | Status: DC | PRN

## 2021-03-26 MED ORDER — ZOLPIDEM TARTRATE 5 MG PO TABS: 5.0000 mg | ORAL_TABLET | Freq: Every evening | ORAL | Status: DC | PRN

## 2021-03-26 NOTE — Progress Notes (Addendum)
Kendra Sparks is a 41 y.o. G3P2002 at [redacted]w[redacted]d by ultrasound admitted for active labor ?Called by RN . Pt admitted through MAU. Phone was inadvertently on mute. Desires natural birth. Pih labs were normal yesterday except plt 106k. Urine PCR pending ?Subjective: ?Chief Complaint  ?Patient presents with  ? Contractions  ? ? ?Objective: ?BP (!) 154/64   Pulse (!) 58   Temp 98 ?F (36.7 ?C) (Oral)   Resp 18   Ht 5\' 4"  (1.626 m)   Wt 72.3 kg   SpO2 100%   BMI 27.34 kg/m?  ?No intake/output data recorded. ?No intake/output data recorded. ? ?FHT:  FHR: 120 bpm, variability: moderate,  accelerations:  Present,  decelerations:  Present variables ?UC:   irregular, every 2-3 minutes ?SVE:   8 cm dilated, 100% effaced, -1 station, bulging membrane. Clear fluid ? ? ?Labs: ?Lab Results  ?Component Value Date  ? WBC 8.5 03/26/2021  ? HGB 14.1 03/26/2021  ? HCT 41.5 03/26/2021  ? MCV 91.8 03/26/2021  ? PLT 112 (L) 03/26/2021  ? ? ?Assessment / Plan: ?Spontaneous labor, progressing normally ?Term ?Variables due to cord compression ?Thrombocytopenia eval for preeclampsia ?P) exaggerates sims. Await urine PCR( pt has not given sample) ? ? ?Anticipated MOD:  NSVD ? ?Kendra Sparks A Kady Toothaker ?03/26/2021, 7:20 AM ? ?  ?

## 2021-03-26 NOTE — MAU Note (Signed)
PT SAYS UC ?YESTERDAY 2 CM ?DENIES HSV ?GBS- POSITIVE ? ?

## 2021-03-26 NOTE — H&P (Signed)
LABOR ADMISSION HISTORY AND PHYSICAL, covering for Private MD ? ?Kendra Sparks is a 41 y.o. female [phone] with IUP at [redacted]w[redacted]d by ultrasound presenting for spontaneous labor. Pt was 7 cm in MAU.  She reports +FMs, No LOF, no VB, no blurry vision, headaches or peripheral edema, and RUQ pain.  PNC complicated by positive GBS, GHTN, marginal cord insertion, hx of gestational diabetes and HELLP in previous pregnancies along with AMA. ? ?Dating: By ultrasound --->  Estimated Date of Delivery: 04/04/21 ? ? ? ?Prenatal History/Complications: ? ?Past Medical History: ?Past Medical History:  ?Diagnosis Date  ? Anal fissure   ? Gestational diabetes   ? Hx of condyloma acuminatum   ? Hx of varicella   ? Pregnancy induced hypertension   ? Vaginal Pap smear, abnormal   ? Vitamin D deficiency   ? ? ?Past Surgical History: ?Past Surgical History:  ?Procedure Laterality Date  ? NO PAST SURGERIES    ? ? ?Obstetrical History: ?OB History   ? ? Gravida  ?3  ? Para  ?2  ? Term  ?2  ? Preterm  ?   ? AB  ?   ? Living  ?2  ?  ? ? SAB  ?   ? IAB  ?   ? Ectopic  ?   ? Multiple  ?0  ? Live Births  ?2  ?   ?  ?  ? ? ?Social History: ?Social History  ? ?Socioeconomic History  ? Marital status: Married  ?  Spouse name: Not on file  ? Number of children: Not on file  ? Years of education: Not on file  ? Highest education level: Not on file  ?Occupational History  ? Not on file  ?Tobacco Use  ? Smoking status: Never  ? Smokeless tobacco: Never  ?Vaping Use  ? Vaping Use: Never used  ?Substance and Sexual Activity  ? Alcohol use: Not Currently  ?  Comment: OCASS  ? Drug use: No  ? Sexual activity: Yes  ?Other Topics Concern  ? Not on file  ?Social History Narrative  ? Not on file  ? ?Social Determinants of Health  ? ?Financial Resource Strain: Not on file  ?Food Insecurity: Not on file  ?Transportation Needs: Not on file  ?Physical Activity: Not on file  ?Stress: Not on file  ?Social Connections: Not on file  ? ? ?Family  History: ?Family History  ?Problem Relation Age of Onset  ? Hypertension Mother   ? Cancer Mother   ?     breast  ? Diabetes Maternal Grandmother   ? Hypertension Maternal Grandmother   ? Heart attack Maternal Grandfather   ? ? ?Allergies: ?No Known Allergies ? ?Medications Prior to Admission  ?Medication Sig Dispense Refill Last Dose  ? aspirin EC 81 MG tablet Take 81 mg by mouth daily. Swallow whole.   03/25/2021  ? Prenatal Vit-Fe Fumarate-FA (PRENATAL VITAMIN PO) Take by mouth.   03/25/2021  ? ? ? ?Review of Systems  ? ?All systems reviewed and negative except as stated in HPI ? ?Blood pressure (!) 145/71, pulse 85, temperature 97.7 ?F (36.5 ?C), resp. rate 20, height 5\' 4"  (1.626 m), weight 72.3 kg, unknown if currently breastfeeding. ?General appearance: alert, cooperative, and mild distress ?Lungs: clear to auscultation bilaterally ?Heart: regular rate and rhythm ?Abdomen: soft, non-tender; bowel sounds normal ?Pelvic: checked in MAU ?Extremities: Homans sign is negative, no sign of DVT ?Presentation: cephalic ?Fetal monitoringBaseline: 120s bpm, Variability: marked,  Accelerations: Reactive, and Decelerations: Absent ?Uterine activity: q 2-3 minutes ?Dilation: 7.5 ?Effacement (%): 90 ?Station: -1 ?Exam by:: Fabiola Backer, RN ? ? ?Prenatal labs: ?ABO, Rh: --/--/PENDING (03/14 0503) ?Antibody: PENDING (03/14 0503) ?Rubella: Immune (08/30 0000) ?RPR: Nonreactive (08/30 0000)  ?HBsAg: Negative (08/30 0000)  ?HIV:    ?GBS:    ?1 hr Glucola passed ?Genetic screening  Materna  T21 WNL ?Anatomy US cannot see in online prenatals ? ?Prenatal Transfer Tool  ?Maternal Diabetes: No ?Genetic Screening: Normalcursory review of chart shows no abnormalities ?Maternal Ultrasounds/Referrals: Other: ?Fetal Ultrasounds or other Referrals:  None ?Maternal Substance Abuse:  No ?Significant Maternal Medications:  Meds include: Other: baby ASA ?Significant Maternal Lab Results: Group B Strep positive and Other: low  platelets ? ?Results for orders placed or performed during the hospital encounter of 03/26/21 (from the past 24 hour(s))  ?Type and screen [facility]  ? Collection Time: 03/26/21  5:03 AM  ?Result Value Ref Range  ? ABO/RH(D) PENDING   ? Antibody Screen PENDING   ? Sample Expiration    ?  03/29/2021,2359 ?Performed at [facility], [address], [place], [place] [address] ?  ?Resp Panel by RT-PCR (Flu A&B, Covid) Nasopharyngeal Swab  ? Collection Time: 03/26/21  5:07 AM  ? Specimen: Nasopharyngeal Swab; Nasopharyngeal(NP) swabs in vial transport medium  ?Result Value Ref Range  ? SARS Coronavirus 2 by RT PCR NEGATIVE NEGATIVE  ? Influenza A by PCR NEGATIVE NEGATIVE  ? Influenza B by PCR NEGATIVE NEGATIVE  ? ? ?Patient Active Problem List  ? Diagnosis Date Noted  ? Labor and delivery indication for care or intervention 03/26/2021  ? Normal labor and delivery 01/17/2019  ? GDM, class A1 08/29/2014  ? Postpartum care following vaginal delivery- vac assist (1/4) 08/29/2014  ? HELLP syndrome, delivered- vac assist vaginal (8/16), current hospitalization 08/29/2014  ? Indication for care in labor or delivery 08/28/2014  ? SGA (small for gestational age) 08/28/2014  ? Poor fetal growth affecting management of mother in third trimester   ? ? ?Assessment: ?Kendra Sparks is a 41 y.o. G3P2002 at [redacted]w[redacted]d here for spontaneous labor.   ? ?#Labor:will pursue expectant management since baby is doing well ?Pt has had two vacuum assisted deliveries. ?#Pain: Pt declines epidural ?#ID:  GBS prophylaxis with ampicillin ?Per pt her platelets have been "lower."  Current CBC is pending. ?Monitor maternal pulse, it remains in 30-40 range.  Mother is asymptomatic. ?Will continue to review chart and labs. ?Reassess in 1 hour or PRN. ? ?Warden Fillers ?03/26/2021, 5:53 AM ? ?  ?

## 2021-03-26 NOTE — Consult Note (Signed)
?Cardiology Consultation:  ? ?Patient ID: Kendra Sparks ?MRN: [id number]; DOB: 1980-04-25 ? ?Admit date: 03/26/2021 ?Date of Consult: 03/26/2021 ? ?PCP:  Deeann SaintBanks, Shannon R, MD ?  ?[facility] Providers ?Cardiologist:  None    ? ? ?Patient Profile:  ? ?Kendra Sparks is a 41 y.o. female with a hx of gestational diabetes, HTN, HELLP who is being seen 03/26/2021 for the evaluation of bradycardia at the request of Dr. Cherly Hensenousins. ? ?History of Present Illness:  ? ?Ms. Kendra Sparks is a 41 yo female with PMH noted above. She has never been evaluated by cardiology in the past. Denies any cardiac hx. Reports her only issues have been DM and HTN in the setting of pregnancies. She has never had palpitations, light-headedness or dizziness. No syncope.   ? ?She presented to the [facility]U on 3/14 in labor and noted to be 7 cm dilated. It was reported her HR was in the 30-40 range during labor according to pulse oximetry. She delivered this morning around 8am. Since that time has been placed on IV mag therapy for preeclampsia in the setting of low platelet count and elevated urine PCR. It was noted her HR on the pulse ox was in the 30-40 range by the bedside RN this afternoon. On call MD was notified and EKG ordered showing SR with PACs. In talking with patient she reports going well post delivery. No dizziness, light-headedness or near syncope. She is not on cardiac telemetry at this time, pulse shows HR in the 60s. Cardiology called for further evaluation.  ? ? ?Past Medical History:  ?Diagnosis Date  ? Anal fissure   ? Gestational diabetes   ? Hx of condyloma acuminatum   ? Hx of varicella   ? Pregnancy induced hypertension   ? Vaginal Pap smear, abnormal   ? Vitamin D deficiency   ? ? ?Past Surgical History:  ?Procedure Laterality Date  ? NO PAST SURGERIES    ?  ? ?Home Medications:  ?Prior to Admission medications   ?Medication Sig Start Date End Date Taking? Authorizing Provider  ?Prenatal Vit-Fe  Fumarate-FA (PRENATAL VITAMIN PO) Take by mouth.   Yes [provider]  ? ? ?Inpatient Medications: ?Scheduled Meds: ? ferrous sulfate  325 mg Oral BID WC  ? ibuprofen  600 mg Oral Q6H  ? prenatal multivitamin  1 tablet Oral Q1200  ? [START ON 03/27/2021] senna-docusate  2 tablet Oral Daily  ? ?Continuous Infusions: ? lactated ringers    ? lactated ringers Stopped (03/26/21 0747)  ? lactated ringers 10 mL/hr at 03/26/21 1021  ? magnesium sulfate 2 g/hr (03/26/21 1023)  ? oxytocin    ? ?PRN Meds: ?acetaminophen, benzocaine-Menthol, coconut oil, witch hazel-glycerin **AND** dibucaine, diphenhydrAMINE, lactated ringers, lidocaine (PF), ondansetron **OR** ondansetron (ZOFRAN) IV, oxyCODONE, oxyCODONE, simethicone, sodium citrate-citric acid, zolpidem ? ?Allergies:   No Known Allergies ? ?Social History:   ?Social History  ? ?Socioeconomic History  ? Marital status: Married  ?  Spouse name: Not on file  ? Number of children: Not on file  ? Years of education: Not on file  ? Highest education level: Not on file  ?Occupational History  ? Not on file  ?Tobacco Use  ? Smoking status: Never  ? Smokeless tobacco: Never  ?Vaping Use  ? Vaping Use: Never used  ?Substance and Sexual Activity  ? Alcohol use: Not Currently  ?  Comment: [employer]  ? Drug use: No  ? Sexual activity: Yes  ?Other Topics Concern  ?  Not on file  ?Social History Narrative  ? Not on file  ? ?Social Determinants of Health  ? ?Financial Resource Strain: Not on file  ?Food Insecurity: Not on file  ?Transportation Needs: Not on file  ?Physical Activity: Not on file  ?Stress: Not on file  ?Social Connections: Not on file  ?Intimate Partner Violence: Not on file  ?  ?Family History:   ? ?Family History  ?Problem Relation Age of Onset  ? Hypertension Mother   ? Cancer Mother   ?     breast  ? Diabetes Maternal Grandmother   ? Hypertension Maternal Grandmother   ? Heart attack Maternal Grandfather   ?  ? ?ROS:  ?Please see the history of present illness.   ? ?All other ROS reviewed and negative.    ? ?Physical Exam/Data:  ? ?Vitals:  ? 03/26/21 1039 03/26/21 1055 03/26/21 1153 03/26/21 1249  ?BP: (!) 145/73 139/73 128/72 117/61  ?Pulse: (!) 53 (!) 53 (!) 54 71  ?Resp: 16 17 16 17   ?Temp:      ?TempSrc:      ?SpO2: 97% 97% 100% 100%  ?Weight:      ?Height:      ? ? ?Intake/Output Summary (Last 24 hours) at 03/26/2021 1346 ?Last data filed at 03/26/2021 1300 ?Gross per 24 hour  ?Intake 711.19 ml  ?Output 500 ml  ?Net 211.19 ml  ? ?Last 3 Weights 03/26/2021 03/21/2021 01/17/2019  ?Weight (lbs) 159 lb 4.8 oz 154 lb 9.6 oz 156 lb 9.6 oz  ?Weight (kg) 72.258 kg 70.126 kg 71.033 kg  ?   ?Body mass index is 27.34 kg/m?.  ?General:  Well nourished, well developed, in no acute distress. ?HEENT: normal ?Neck: no JVD ?Vascular: No carotid bruits; Distal pulses 2+ bilaterally ?Cardiac:  normal S1, S2; RRR; no murmur  ?Lungs:  clear to auscultation bilaterally, no wheezing, rhonchi or rales  ?Abd: soft, nontender, no hepatomegaly  ?Ext: no edema ?Musculoskeletal:  No deformities, BUE and BLE strength normal and equal ?Skin: warm and dry  ?Neuro:  CNs 2-12 intact, no focal abnormalities noted ?Psych:  Normal affect  ? ?EKG:  The EKG was personally reviewed and demonstrates:  SR 63 bpm, PACs, PVC ?Telemetry:  Telemetry was personally reviewed and demonstrates:  N/a  ? ?Relevant CV Studies: ? ?N/a  ? ?Laboratory Data: ? ?High Sensitivity Troponin:  No results for input(s): TROPONINIHS in the last 720 hours.   ?Chemistry ?Recent Labs  ?Lab 03/21/21 ?1727 03/26/21 ?0503  ?NA 136 138  ?K 3.8 4.1  ?CL 105 110  ?CO2 24 19*  ?GLUCOSE 80 99  ?BUN 10 12  ?CREATININE 0.92 0.82  ?CALCIUM 9.2 9.0  ?GFRNONAA >60 >60  ?ANIONGAP 7 9  ?  ?Recent Labs  ?Lab 03/21/21 ?1727 03/26/21 ?0503  ?PROT 6.3* 6.2*  ?ALBUMIN 3.0* 3.1*  ?AST 17 24  ?ALT 12 11  ?ALKPHOS 177* 167*  ?BILITOT 0.9 0.9  ? ?Lipids No results for input(s): CHOL, TRIG, HDL, LABVLDL, LDLCALC, CHOLHDL in the last 168 hours.  ?Hematology ?Recent  Labs  ?Lab 03/21/21 ?1727 03/26/21 ?0503  ?WBC 6.6 8.5  ?RBC 4.33 4.52  ?HGB 13.5 14.1  ?HCT 39.8 41.5  ?MCV 91.9 91.8  ?MCH 31.2 31.2  ?MCHC 33.9 34.0  ?RDW 13.0 13.2  ?PLT 126* 112*  ? ?Thyroid No results for input(s): TSH, FREET4 in the last 168 hours.  ?BNPNo results for input(s): BNP, PROBNP in the last 168 hours.  ?DDimer No results for  input(s): DDIMER in the last 168 hours. ? ? ?Radiology/Studies:  ?No results found. ? ? ?Assessment and Plan:  ? ?Merica Monette Karey Suthers is a 41 y.o. female with a hx of gestational diabetes, HTN, HELLP who is being seen 03/26/2021 for the evaluation of bradycardia at the request of Dr. Cherly Hensen. ? ?Possible Bradycardia: notes indicate her HR was in the 30-40 range while in labor, suspect could have been vagal response with labor. She has been on pulse oximetry since that time. EKG done today shows SR 63 bpm with PACs, PVC. She has had no cardiac complaints. Suspect the pulse ox is not accurately noting her HR with her freq PACs. Currently being treated with IV mag for preeclampsia as well.  ?-- Follow electrolytes. If she develops symptoms would suggest further monitoring on cardiac telemetry. For now would continue to monitor ? ?Preeclampsia: does have hx of the same ?-- management per primary team ? ?For questions or updates, please contact [facility] ?Please consult [email or web address] for contact info under  ? ? ?Signed, ?Laverda Page, NP  ?03/26/2021 1:46 PM ? ?

## 2021-03-26 NOTE — Progress Notes (Signed)
Called by RN regarding maternal bradycardia . Pt was placed on telemetry . EKG done.( Sinus rhythm with PAC  with aberrant condition Pt has no hx of cardiac problem. Currently on magnesium sulfate for preeclampsia based on low plt ct and elevated urine PCR.  Cardiology consult called.  ?

## 2021-03-26 NOTE — Lactation Note (Signed)
This note was copied from a baby's chart. ?Lactation Consultation Note ? ?Patient Name: Kendra Sparks ?Today's Date: 03/26/2021 ?Reason for consult: L&D Initial assessment ?Age:39 hours ? ?P3, Baby cueing and has breastfed x2. ?Assisted with latching on L breast with ease. ?Lactation to follow up on MBU.  ? ?Maternal Data ?Does the patient have breastfeeding experience prior to this delivery?: Yes ?How long did the patient breastfeed?: 6 mos. & 18 mos. ? ?Feeding ?Mother's Current Feeding Choice: Breast Milk ? ?LATCH Score ?Latch: Grasps breast easily, tongue down, lips flanged, rhythmical sucking. ? ?Audible Swallowing: A few with stimulation ? ?Type of Nipple: Everted at rest and after stimulation ? ?Comfort (Breast/Nipple): Soft / non-tender ? ?Hold (Positioning): Assistance needed to correctly position infant at breast and maintain latch. ? ?LATCH Score: 8 ? ? ?Interventions ?Interventions: Assisted with latch;Skin to skin;Education ? ? ?Consult Status ?Consult Status: Follow-up from L&D ? ? ? ?Dahlia Byes Boschen ?03/26/2021, 8:23 AM ? ? ? ?

## 2021-03-26 NOTE — Lactation Note (Signed)
This note was copied from a baby's chart. ?Lactation Consultation Note ? ?Patient Name: Kendra Sparks ?Today's Date: 03/26/2021 ?Reason for consult: Initial assessment;Mother's request;Early term 37-38.6wks;Breastfeeding assistance;Maternal endocrine disorder;Other (Comment) (PIH ( MgSulfate) Anemia ( Ferrous Sulfate)) ?Age:39 hours ? ?LC assisted with latching but infant recent feeding and went to sleep.  ?Mom feeding choice breast / formula she has not given a bottle yet. Mom like to pump with DEBP and see if she can give colostrum first.  ? ?Plan 1. To feed based on cues 8-12x 24hr period. Mom to offer breasts and look for signs of milk transfer. Mom latched x3 on the floor and notes uterine contraction with breastfeeding.  ?3. Mom to supplement with eBM first followed by formula with slow flow nipple and pace bottle feeding. BF supplementation guide provided and reviewed.  ?4. DEBP q 3hrs for 15 min  ?5 I and O sheet reviewed.  ?All questions answered at the end of the visit.  ? ?Maternal Data ?Has patient been taught Hand Expression?: Yes ?Does the patient have breastfeeding experience prior to this delivery?: Yes ?How long did the patient breastfeed?: 18 months for one child and 6 months for the other ? ?Feeding ?Mother's Current Feeding Choice: Breast Milk and Formula ? ?LATCH Score ?  ? ?  ? ?  ? ?  ? ?  ? ?  ? ? ?Lactation Tools Discussed/Used ?Tools: Pump;Flanges ?Flange Size: 27 ?Breast pump type: Double-Electric Breast Pump ?Pump Education: Setup, frequency, and cleaning;Milk Storage ?Reason for Pumping: increase stimulation ?Pumping frequency: every 3 hrs for 15 min ? ?Interventions ?Interventions: Breast feeding basics reviewed;Assisted with latch;Skin to skin;Breast massage;Hand express;Breast compression;Adjust position;Position options;Support pillows;Expressed milk;DEBP;Education;Pace feeding;LC Psychologist, educational;Infant Driven Feeding Algorithm education ? ?Discharge ?Pump:  Personal;DEBP ?WIC Program: No ? ?Consult Status ?Consult Status: Follow-up ?Date: 03/27/21 ?Follow-up type: In-patient ? ? ? ?Chukwuemeka Artola  Nicholson-Springer ?03/26/2021, 5:49 PM ? ? ? ?

## 2021-03-27 LAB — CBC
HCT: 37 % (ref 36.0–46.0)
Hemoglobin: 12.6 g/dL (ref 12.0–15.0)
MCH: 30.9 pg (ref 26.0–34.0)
MCHC: 34.1 g/dL (ref 30.0–36.0)
MCV: 90.7 fL (ref 80.0–100.0)
Platelets: 115 10*3/uL — ABNORMAL LOW (ref 150–400)
RBC: 4.08 MIL/uL (ref 3.87–5.11)
RDW: 13.3 % (ref 11.5–15.5)
WBC: 13 10*3/uL — ABNORMAL HIGH (ref 4.0–10.5)
nRBC: 0 % (ref 0.0–0.2)

## 2021-03-27 MED ORDER — MAGNESIUM SULFATE 40 GM/1000ML IV SOLN: 2.0000 g/h | INTRAVENOUS | Status: DC

## 2021-03-27 MED ORDER — NIFEDIPINE ER OSMOTIC RELEASE 30 MG PO TB24
30.0000 mg | ORAL_TABLET | Freq: Every day | ORAL | Status: DC
  Administered 2021-03-27 – 2021-03-28 (×2): 30 mg via ORAL
  Filled 2021-03-27 (×2): qty 1

## 2021-03-27 NOTE — Progress Notes (Signed)
? ?Progress Note ? ?Patient Name: [facility] Kendra Sparks ?Date of Encounter: 03/27/2021 ? ?[facility] Cardiologist: None  ? ?Subjective  ? ?Kendra Sparks feels well this AM. She has her baby with her. She is asymptomatic. ? ?Inpatient Medications  ?  ?Scheduled Meds: ? ferrous sulfate  325 mg Oral BID WC  ? ibuprofen  600 mg Oral Q6H  ? prenatal multivitamin  1 tablet Oral Q1200  ? senna-docusate  2 tablet Oral Daily  ? ?Continuous Infusions: ? lactated ringers    ? lactated ringers Stopped (03/26/21 0747)  ? lactated ringers 10 mL/hr at 03/26/21 1021  ? magnesium sulfate 2 g/hr (03/27/21 0500)  ? oxytocin    ? ?PRN Meds: ?acetaminophen, benzocaine-Menthol, coconut oil, witch hazel-glycerin **AND** dibucaine, diphenhydrAMINE, lactated ringers, lidocaine (PF), ondansetron **OR** ondansetron (ZOFRAN) IV, oxyCODONE, oxyCODONE, simethicone, sodium citrate-citric acid, zolpidem  ? ?Vital Signs  ?  ?Vitals:  ? 03/27/21 0500 03/27/21 0600 03/27/21 0654 03/27/21 0803  ?BP:    127/83  ?Pulse:    80  ?Resp: 16 17 17 18   ?Temp:    98 ?F (36.7 ?C)  ?TempSrc:    Oral  ?SpO2:    99%  ?Weight:      ?Height:      ? ? ?Intake/Output Summary (Last 24 hours) at 03/27/2021 0843 ?Last data filed at 03/27/2021 0654 ?Gross per 24 hour  ?Intake 3761.95 ml  ?Output 3500 ml  ?Net 261.95 ml  ? ?Last 3 Weights 03/26/2021 03/21/2021 01/17/2019  ?Weight (lbs) 159 lb 4.8 oz 154 lb 9.6 oz 156 lb 9.6 oz  ?Weight (kg) 72.258 kg 70.126 kg 71.033 kg  ?   ? ?Telemetry  ?  ?Sinus rhythm - Personally Reviewed ? ?ECG  ?  ?No new - Personally Reviewed ? ?Physical Exam  ? ?Vitals:  ? 03/27/21 0654 03/27/21 0803  ?BP:  127/83  ?Pulse:  80  ?Resp: 17 18  ?Temp:  98 ?F (36.7 ?C)  ?SpO2:  99%  ? ? ?GEN: No acute distress.   ?Neck: No JVD ?Cardiac: RRR, no murmurs, rubs, or gallops.  ?Respiratory: Clear to auscultation bilaterally. ?GI: Soft, nontender, non-distended  ?MS: No edema; No deformity. ?Neuro:  Nonfocal  ?Psych: Normal affect  ? ?Labs  ?  ?High  Sensitivity Troponin:  No results for input(s): TROPONINIHS in the last 720 hours.   ?Chemistry ?Recent Labs  ?Lab 03/21/21 ?1727 03/26/21 ?0503 03/26/21 ?1421  ?NA 136 138  --   ?K 3.8 4.1  --   ?CL 105 110  --   ?CO2 24 19*  --   ?GLUCOSE 80 99  --   ?BUN 10 12  --   ?CREATININE 0.92 0.82  --   ?CALCIUM 9.2 9.0  --   ?MG  --   --  4.9*  ?PROT 6.3* 6.2*  --   ?ALBUMIN 3.0* 3.1*  --   ?AST 17 24  --   ?ALT 12 11  --   ?ALKPHOS 177* 167*  --   ?BILITOT 0.9 0.9  --   ?GFRNONAA >60 >60  --   ?ANIONGAP 7 9  --   ?  ?Lipids No results for input(s): CHOL, TRIG, HDL, LABVLDL, LDLCALC, CHOLHDL in the last 168 hours.  ?Hematology ?Recent Labs  ?Lab 03/21/21 ?1727 03/26/21 ?0503 03/27/21 ?0446  ?WBC 6.6 8.5 13.0*  ?RBC 4.33 4.52 4.08  ?HGB 13.5 14.1 12.6  ?HCT 39.8 41.5 37.0  ?MCV 91.9 91.8 90.7  ?MCH 31.2 31.2 30.9  ?  MCHC 33.9 34.0 34.1  ?RDW 13.0 13.2 13.3  ?PLT 126* 112* 115*  ? ?Thyroid No results for input(s): TSH, FREET4 in the last 168 hours.  ?BNPNo results for input(s): BNP, PROBNP in the last 168 hours.  ?DDimer No results for input(s): DDIMER in the last 168 hours.  ? ?Radiology  ?  ?No results found. ? ?Cardiac Studies  ? ?None ? ?Patient Profile  ?   ?Kendra Sparks is a 41 y.o. female with a hx of gestational diabetes, HTN, HELLP who is being seen 03/26/2021 for the evaluation of low rates noted on pulse oximetry at the request of Dr. Cherly Hensen.  ? ?Assessment & Plan  ?  ?#Decreased rate on pulse ox: Her ECG showed sinus rhythm with atrial ectopy with aberrant conduction. Overnight, telemetry shows sinus rhythm. No significant bradycardia or AV block. Minimal ventricular or atrial ectopy is benign. Moving forward with hx of preeclampsia will be important for her to follow with her PCP to continue to monitor her blood pressures. ?- no medications recommended ?- no further work up is warranted ? ? ? ?[facility] will sign off.   ?Medication Recommendations:  None ?Other recommendations (labs,  testing, etc):  None ?Follow up as an outpatient:  None ? ?For questions or updates, please contact [facility] ?Please consult [email or web address] for contact info under  ? ?  ?   ?Signed, ?Maisie Fus, MD  ?03/27/2021, 8:43 AM   ? ?

## 2021-03-27 NOTE — Progress Notes (Signed)
PPD1 SVD:  ? ?S:  Pt reports feeling well. Sl teary as mother dc with recurrence of her breast cancer/ Tolerating po/ Voiding without problems/ No n/v/ Bleeding is light/ Pain controlled withprescription NSAID's including ibuprofen (Motrin) ? Newborn info live female no circ ? ? ?O:  A & O x 3 / VS: Blood pressure (!) 149/73, pulse 60, temperature 98.2 ?F (36.8 ?C), temperature source Oral, resp. rate 18, height 5\' 4"  (1.626 m), weight 72.3 kg, SpO2 99 %, unknown if currently breastfeeding. ?Patient Vitals for the past 24 hrs: ? BP Temp Temp src Pulse Resp SpO2  ?03/27/21 1940 (!) 149/73 98.2 ?F (36.8 ?C) Oral 60 18 99 %  ?03/27/21 1625 (!) 145/82 97.7 ?F (36.5 ?C) Oral 65 18 98 %  ?03/27/21 1211 135/90 98.1 ?F (36.7 ?C) Oral 67 18 99 %  ?03/27/21 1025 -- -- -- -- 17 --  ?03/27/21 0905 -- -- -- -- 16 --  ?03/27/21 0803 127/83 98 ?F (36.7 ?C) Oral 80 18 99 %  ?03/27/21 0654 -- -- -- -- 17 --  ?03/27/21 0600 -- -- -- -- 17 --  ?03/27/21 0500 -- -- -- -- 16 --  ?03/27/21 0435 129/82 98 ?F (36.7 ?C) Oral 68 16 98 %  ?03/27/21 0151 -- -- -- -- 16 --  ?03/27/21 0100 -- -- -- -- 17 --  ?03/27/21 0000 -- -- -- -- 18 --  ?03/26/21 2315 137/73 97.6 ?F (36.4 ?C) Oral 68 16 100 %  ?03/26/21 2200 -- -- -- -- 18 --  ?03/26/21 2100 -- -- -- -- 17 --  ?03/26/21 2005 127/78 97.7 ?F (36.5 ?C) Oral 71 18 100 %  ?  ? LABS: ?Results for orders placed or performed during the hospital encounter of 03/26/21 (from the past 24 hour(s))  ?CBC     Status: Abnormal  ? Collection Time: 03/27/21  4:46 AM  ?Result Value Ref Range  ? WBC 13.0 (H) 4.0 - 10.5 K/uL  ? RBC 4.08 3.87 - 5.11 MIL/uL  ? Hemoglobin 12.6 12.0 - 15.0 g/dL  ? HCT 37.0 36.0 - 46.0 %  ? MCV 90.7 80.0 - 100.0 fL  ? MCH 30.9 26.0 - 34.0 pg  ? MCHC 34.1 30.0 - 36.0 g/dL  ? RDW 13.3 11.5 - 15.5 %  ? Platelets 115 (L) 150 - 400 K/uL  ? nRBC 0.0 0.0 - 0.2 %  ? ? I&O: I/O last 3 completed shifts: ?In: 5552 [P.O.:4040; I.V.:1512] ?Out: 6800 [Urine:6700; Blood:100] ?  No intake/output data  recorded. ? Lungs: chest clear, no wheezing, rales, normal symmetric air entry ? Heart: regular rate and rhythm, S1, S2 normal, no murmur, click, rub or gallop ? Abdomen: uterus firm at umb soft lax ? Perineum: is normal ? Lochia: light ? Extremities:no redness or tenderness in the calves or thighs, no edema ?  ? ?A/P: PPD # 1/ [id number] s/p magnesium sulfate for preeclampsia ? Doing well ? Continue routine post partum orders ? Will start procardia xl ?D/c in am  ?

## 2021-03-28 MED ORDER — NIFEDIPINE ER 30 MG PO TB24: 30.0000 mg | ORAL_TABLET | Freq: Every day | ORAL | 11 refills | Status: DC

## 2021-03-28 MED ORDER — IBUPROFEN 600 MG PO TABS
600.0000 mg | ORAL_TABLET | Freq: Four times a day (QID) | ORAL | 11 refills | Status: DC | PRN
Start: 2021-03-28 — End: 2021-07-19

## 2021-03-28 NOTE — Progress Notes (Signed)
PPD2 SVD:  ? ?S:  Pt reports feeling well. Started on procardia. Sl h/a this am ? Related to med/ Tolerating po/ Voiding without problems/ No n/v/ Bleeding is light/ Pain controlled withprescription NSAID's including ibuprofen (Motrin) ? Newborn info live female ? ? ?O:  A & O x 3 / VS: Blood pressure 116/68, pulse 60, temperature 98.4 ?F (36.9 ?C), temperature source Oral, resp. rate 18, height 5\' 4"  (1.626 m), weight 72.3 kg, SpO2 100 %, unknown if currently breastfeeding. ? LABS: No results found for this or any previous visit (from the past 24 hour(s)). ? I&O: I/O last 3 completed shifts: ?In: 3821 [P.O.:2900; I.V.:921] ?Out: 5900 [Urine:5900] ?  No intake/output data recorded. ? Lungs: chest clear, no wheezing, rales, normal symmetric air entry ? Heart: regular rate and rhythm, S1, S2 normal, no murmur, click, rub or gallop ? Abdomen: uterus firm at umb non tender ? Perineum: is normal ? Lochia: light ? Extremities:no redness or tenderness in the calves or thighs, no edema ?  ? ?A/P: PPD # 2/ [id number] s/p VAVD ?S/p magnesium for preeclampsia on procardia for elevated BP ? Doing well ? Continue routine post partum orders ? D/c instructions reviewed ?F/u 1 wk for BP check ? Will refer to [facility] ( [facility]) for long term management  ?

## 2021-03-28 NOTE — Discharge Instructions (Signed)
Call if temperature greater than equal to 100.4, nothing per vagina for 4-6 weeks or severe nausea vomiting, increased incisional pain , drainage or redness in the incision site, no straining with bowel movements, showers no bath °

## 2021-03-28 NOTE — Discharge Summary (Signed)
? ?  Postpartum Discharge Summary ? ?Date of Service updated ? ?   ?Patient Name: Kendra Sparks ?DOB: 05-07-1980 ?MRN: [id number] ? ?Date of admission: 03/26/2021 ?Delivery date:03/26/2021  ?Delivering provider: Kemontae Dunklee, Alanda Slim  ?Date of discharge: 03/28/2021 ? ?Admitting diagnosis: active labor, gestational thrombocytopenia, maternal bradycardia ?Intrauterine pregnancy: [redacted]w[redacted]d    ?Secondary diagnosis:  GBS cx positive ?Additional problems: gestational thrombocytopenia, hx preeclampsia    ?Discharge diagnosis: Term Pregnancy Delivered, Preeclampsia (mild), and sinus bradycardia                                               ?Post partum procedures:magnesium sulfate ?Augmentation: AROM ?Complications: None ? ?Hospital course: Onset of Labor With Vaginal Delivery      ?41y.o. yo G3P3003 at 331w5das admitted in Active Labor on 03/26/2021. Patient had labor course as follows: presentation in advanced labor. Ampicillin given for (+)_ GBS. AROM due to variable deceleration. Labs pending for r/o atypical preeclampsia ?Membrane Rupture Time/Date: 7:10 AM ,03/26/2021   ?Delivery Method:Vaginal, Vacuum (Extractor)  due to fetal bradycardia ?Episiotomy: None  ?Lacerations:  None  ?Patient had a postpartum course notable for sinus bradycardia requiring telemetry and cardiology consultation( see note). Bradycardia resolved w/o medication.  PCR urine was c/w preeclampsia therefore Magnesium sulfate started pp. Procardia started after d/c magnesium sulfate..  She is ambulating, tolerating a regular diet, passing flatus, and urinating well. Patient is discharged home in stable condition on 03/28/2021 ? ?Newborn Data: ?Birth date:03/26/2021  ?Birth time:7:39 AM  ?Gender:Female  ?Living status:Living  ?Apgars:8 ,9  ?Weight:3.4 kg  ? ?Magnesium Sulfate received: Yes: Seizure prophylaxis ?BMZ received: No ?Rhophylac:No ?MMR:No ?T-DaP:Given prenatally ?Flu: No ?Transfusion:No ? ?Physical exam  ?Vitals:  ? 03/27/21 1940  03/27/21 2335 03/28/21 0443 03/28/21 0817  ?BP: (!) 149/73 122/68 109/63 116/68  ?Pulse: 60 (!) 53 (!) 59 60  ?Resp: _0 ?Temp: 98.2 ?F (36.8 ?C) 98 ?F (36.7 ?C) 98 ?F (36.7 ?C) 98.4 ?F (36.9 ?C)  ?TempSrc: Oral Oral Oral Oral  ?SpO2: 99% 99% 99% 100%  ?Weight:      ?Height:      ? ?General: alert, cooperative, and no distress ?Lochia: appropriate ?Uterine Fundus: firm ?Incision: N/A ?DVT Evaluation: No evidence of DVT seen on physical exam. ?No significant calf/ankle edema. ?Labs: ?Lab Results  ?Component Value Date  ? WBC 13.0 (H) 03/27/2021  ? HGB 12.6 03/27/2021  ? HCT 37.0 03/27/2021  ? MCV 90.7 03/27/2021  ? PLT 115 (L) 03/27/2021  ? ?CMP Latest Ref Rng & Units 03/26/2021  ?Glucose 70 - 99 mg/dL 99  ?BUN 6 - 20 mg/dL 12  ?Creatinine 0.44 - 1.00 mg/dL 0.82  ?Sodium 135 - 145 mmol/L 138  ?Potassium 3.5 - 5.1 mmol/L 4.1  ?Chloride 98 - 111 mmol/L 110  ?CO2 22 - 32 mmol/L 19(L)  ?Calcium 8.9 - 10.3 mg/dL 9.0  ?Total Protein 6.5 - 8.1 g/dL 6.2(L)  ?Total Bilirubin 0.3 - 1.2 mg/dL 0.9  ?Alkaline Phos 38 - 126 U/L 167(H)  ?AST 15 - 41 U/L 24  ?ALT 0 - 44 U/L 11  ? ?Edinburgh Score: ?Edinburgh Postnatal Depression Scale Screening Tool 01/17/2019  ?I have been able to laugh and see the funny side of things. 0  ?I have looked forward with enjoyment to things. 0  ?I have blamed myself unnecessarily when things  went wrong. 0  ?I have been anxious or worried for no good reason. 2  ?I have felt scared or panicky for no good reason. 1  ?Things have been getting on top of me. 0  ?I have been so unhappy that I have had difficulty sleeping. 0  ?I have felt sad or miserable. 0  ?I have been so unhappy that I have been crying. 0  ?The thought of harming myself has occurred to me. 0  ?Edinburgh Postnatal Depression Scale Total 3  ? ? ? ? ?After visit meds:  ?Allergies as of 03/28/2021   ?No Known Allergies ?  ? ?  ?Medication List  ?  ? ?TAKE these medications   ? ?ibuprofen 600 MG tablet ?Commonly known as: ADVIL ?Take 1  tablet (600 mg total) by mouth every 6 (six) hours as needed for moderate pain. ?  ?NIFEdipine 30 MG 24 hr tablet ?Commonly known as: ADALAT CC ?Take 1 tablet (30 mg total) by mouth daily. ?  ?PRENATAL VITAMIN PO ?Take by mouth. ?  ? ?  ? ? ? ?Discharge home in stable condition ?Infant Feeding: Breast ?Infant Disposition:home with mother ?Discharge instruction: per After Visit Summary and Postpartum booklet. ?Activity: Advance as tolerated. Pelvic rest for 6 weeks.  ?Diet: routine diet ?Anticipated Birth Control: Condoms ?Postpartum Appointment:1 week ?Additional Postpartum F/U: BP check 1 week ?Future Appointments:No future appointments. ?Follow up Visit: ? Follow-up Information   ? ? Servando Salina, MD Follow up in 1 week(s).   ?Specialty: Obstetrics and Gynecology ?Why: BP check ?Contact information: ?[address] ?[place] [address] ?[phone] ? ? ?  ?  ? ?  ?  ? ?  ? ? ? ?  ? ?03/28/2021 ?Marvene Staff, MD ? ? ?

## 2021-03-30 ENCOUNTER — Inpatient Hospital Stay (HOSPITAL_COMMUNITY): Payer: BC Managed Care – PPO

## 2021-03-30 ENCOUNTER — Inpatient Hospital Stay (HOSPITAL_COMMUNITY)
Admission: AD | Admit: 2021-03-30 | Payer: BC Managed Care – PPO | Source: Home / Self Care | Admitting: Obstetrics and Gynecology

## 2021-04-06 ENCOUNTER — Telehealth (HOSPITAL_COMMUNITY): Payer: Self-pay

## 2021-04-06 NOTE — Telephone Encounter (Signed)
"  Doing well." Patient has no questions or concerns about her healing.   ? ?"He's doing well. Eating and gaining weight. He sleeps in a crib." RN reviewed ABC's of safe sleep with patient. Patient declines any questions or concerns about baby. ? ?EPDS score is 3. ? ?Kendra Sparks,RN3,MSN,RNC-MNN ?04/06/2021,1433 ?

## 2021-04-17 NOTE — Progress Notes (Signed)
?Cardio-Obstetrics Clinic ? ?New Evaluation ? ?Date:  04/19/2021  ? ?ID:  Kendra Sparks, DOB March 31, 1980, MRN [id number] ? ?PCP:  Deeann Saint, MD ?  ?[facility] Providers ?Cardiologist:  None  ?Electrophysiologist:  None      ? ?Referring MD: Deeann Saint, MD  ? ?Chief Complaint: Pre-eclampsia ? ?History of Present Illness:   ? ?Kendra Sparks is a 41 y.o. female [G3P3003] who is being seen today for the evaluation of pre-eclampsia at the request of Deeann Saint, MD.  ? ?Patient has history of gestational diabetes, HTN, HELLP Syndrome. She was recently admitted in 03/2021 for active labor with course complicated by post-partum pre-eclampsia and bradycardia where HR reported to be 30-40. On Cardiology evaluation, ECG showed NSR with PACs and PVC. HR in 60s. She was monitored on telemetry and did well with rare ectopy. No further testing recommended at that time.  ? ?Today, the patient overall feels well. No chest pain, SOB, LE edema, orthopnea or PND. Had palpitations right after her hospitalization but these have resolved. Blood pressure is overall well controlled. Currently taking nifedipine 30mg  daily. ? ? ?Prior CV Studies Reviewed: ?The following studies were reviewed today: ?No CV studies ? ?Past Medical History:  ?Diagnosis Date  ? Anal fissure   ? Gestational diabetes   ? Hx of condyloma acuminatum   ? Hx of varicella   ? Pregnancy induced hypertension   ? Vaginal Pap smear, abnormal   ? Vitamin D deficiency   ? ? ?Past Surgical History:  ?Procedure Laterality Date  ? NO PAST SURGERIES    ?   ? ?OB History   ? ? Gravida  ?3  ? Para  ?3  ? Term  ?3  ? Preterm  ?   ? AB  ?   ? Living  ?3  ?  ? ? SAB  ?   ? IAB  ?   ? Ectopic  ?   ? Multiple  ?0  ? Live Births  ?3  ?   ?  ?  ?    ? ? ?Current Medications: ?Current Meds  ?Medication Sig  ? NIFEdipine (ADALAT CC) 30 MG 24 hr tablet Take 1 tablet (30 mg total) by mouth daily.  ? Prenatal Vit-Fe Fumarate-FA (PRENATAL  VITAMIN PO) Take by mouth.  ?  ? ?Allergies:   Patient has no known allergies.  ? ?Social History  ? ?Socioeconomic History  ? Marital status: Married  ?  Spouse name: Not on file  ? Number of children: Not on file  ? Years of education: Not on file  ? Highest education level: Not on file  ?Occupational History  ? Not on file  ?Tobacco Use  ? Smoking status: Never  ? Smokeless tobacco: Never  ?Vaping Use  ? Vaping Use: Never used  ?Substance and Sexual Activity  ? Alcohol use: Not Currently  ?  Comment: OCASS  ? Drug use: No  ? Sexual activity: Yes  ?Other Topics Concern  ? Not on file  ?Social History Narrative  ? Not on file  ? ?Social Determinants of Health  ? ?Financial Resource Strain: Not on file  ?Food Insecurity: Not on file  ?Transportation Needs: Not on file  ?Physical Activity: Not on file  ?Stress: Not on file  ?Social Connections: Not on file  ?  ? ? ?Family History  ?Problem Relation Age of Onset  ? Hypertension Mother   ? Cancer Mother   ?  breast  ? Diabetes Maternal Grandmother   ? Hypertension Maternal Grandmother   ? Heart attack Maternal Grandfather   ?   ? ?ROS:   ?Please see the history of present illness.    ?Review of Systems  ?Constitutional:  Negative for malaise/fatigue.  ?Respiratory:  Negative for shortness of breath.   ?Cardiovascular:  Negative for chest pain, palpitations, orthopnea, claudication, leg swelling and PND.  ?Gastrointestinal:  Negative for blood in stool.  ?Genitourinary:  Negative for hematuria.  ?Musculoskeletal:  Negative for falls.  ?Neurological:  Negative for dizziness and loss of consciousness.   ? ? ?Labs/EKG Reviewed:   ? ?EKG:   ?EKG 03/26/21: NSR with PACs and PVC ? ?Recent Labs: ?03/26/2021: ALT 11; BUN 12; Creatinine, Ser 0.82; Magnesium 4.9; Potassium 4.1; Sodium 138 ?03/27/2021: Hemoglobin 12.6; Platelets 115  ? ?Recent Lipid Panel ?Lab Results  ?Component Value Date/Time  ? CHOL 177 10/17/2016 09:14 AM  ? TRIG 38.0 10/17/2016 09:14 AM  ? HDL 75.30  10/17/2016 09:14 AM  ? CHOLHDL 2 10/17/2016 09:14 AM  ? LDLCALC 94 10/17/2016 09:14 AM  ? ? ?Physical Exam:   ? ?VS:  BP 112/70   Pulse 80   Ht 5\' 4"  (1.626 m)   Wt 135 lb (61.2 kg)   SpO2 97%   BMI 23.17 kg/m?    ? ?Wt Readings from Last 3 Encounters:  ?04/19/21 135 lb (61.2 kg)  ?03/26/21 159 lb 4.8 oz (72.3 kg)  ?03/21/21 154 lb 9.6 oz (70.1 kg)  ?  ? ?GEN:  Well nourished, well developed in no acute distress ?HEENT: Normal ?NECK: No JVD; No carotid bruits ?CARDIAC: RRR, soft systolic murmur ?RESPIRATORY:  Clear to auscultation without rales, wheezing or rhonchi  ?ABDOMEN: Soft, non-tender, non-distended ?MUSCULOSKELETAL:  No edema; No deformity  ?SKIN: Warm and dry ?NEUROLOGIC:  Alert and oriented x 3 ?PSYCHIATRIC:  Normal affect  ? ? ? ?ASSESSMENT & PLAN:   ? ?#Pre-Eclampsia: ?#History of HELLP Syndrome: ?Patient with recent delivery complicated by postpartum pre-eclampsia. PCR positive. Magnesium sulfate and nifedipine were started with improvement. She was discharged home on nifedipine and is currently doing well with BP well controlled at 110s/70s. Discussed that she is at increased CV risk and needs aggressive prevention measures including lifestyle modifications, blood pressure, cholesterol and A1C monitoring.  ?-Continue nifedipine 30mg  daily ?-BP well controlled ? ?#Bradycardia: ?#Frequent PACs: ?Occurred during active labor. Recovered with HR 60s on Cardiology evaluation. Cardiac monitoring in the hospital reportedly with rare ectopy. Currently, patient denies palpitations. Will check TTE to ensure no structural abnormalities. Discussed if palpitations recur, can pursue cardiac monitor at that time. ?-Check TTE ? ?Patient Instructions  ?Medication Instructions:  ? ?Your physician recommends that you continue on your current medications as directed. Please refer to the Current Medication list given to you today. ? ?*If you need a refill on your cardiac medications before your next appointment,  please call your pharmacy* ? ? ?Testing/Procedures: ? ?Your physician has requested that you have an echocardiogram. Echocardiography is a painless test that uses sound waves to create images of your heart. It provides your doctor with information about the size and shape of your heart and how well your heart?s chambers and valves are working. This procedure takes approximately one hour. There are no restrictions for this procedure. ? ? ?Follow-Up: ? ?3 MONTHS WITH DR. Shari Prows[place]BERTON OR [facility]BB [facility] ? ? ? ? ? ?Dispo:  No follow-ups on file.  ? ?Medication Adjustments/Labs and  Tests Ordered: ?Current medicines are reviewed at length with the patient today.  Concerns regarding medicines are outlined above.  ?Tests Ordered: ?Orders Placed This Encounter  ?Procedures  ? ECHOCARDIOGRAM COMPLETE  ? ?Medication Changes: ?No orders of the defined types were placed in this encounter. ?  ?

## 2021-04-19 ENCOUNTER — Ambulatory Visit (INDEPENDENT_AMBULATORY_CARE_PROVIDER_SITE_OTHER): Payer: BC Managed Care – PPO | Admitting: Cardiology

## 2021-04-19 ENCOUNTER — Encounter: Payer: Self-pay | Admitting: Cardiology

## 2021-04-19 VITALS — BP 112/70 | HR 80 | Ht 64.0 in | Wt 135.0 lb

## 2021-04-19 DIAGNOSIS — R001 Bradycardia, unspecified: Secondary | ICD-10-CM

## 2021-04-19 DIAGNOSIS — O1495 Unspecified pre-eclampsia, complicating the puerperium: Secondary | ICD-10-CM

## 2021-04-19 DIAGNOSIS — R002 Palpitations: Secondary | ICD-10-CM

## 2021-04-19 DIAGNOSIS — I491 Atrial premature depolarization: Secondary | ICD-10-CM

## 2021-04-19 NOTE — Patient Instructions (Signed)
Medication Instructions:  ? ?Your physician recommends that you continue on your current medications as directed. Please refer to the Current Medication list given to you today. ? ?*If you need a refill on your cardiac medications before your next appointment, please call your pharmacy* ? ? ?Testing/Procedures: ? ?Your physician has requested that you have an echocardiogram. Echocardiography is a painless test that uses sound waves to create images of your heart. It provides your doctor with information about the size and shape of your heart and how well your heart?s chambers and valves are working. This procedure takes approximately one hour. There are no restrictions for this procedure. ? ? ?Follow-Up: ? ?3 MONTHS WITH DR. Johney Frame OR KARDIE TOBB DO HERE AT [place] ? ? ? ?

## 2021-05-07 ENCOUNTER — Ambulatory Visit (HOSPITAL_COMMUNITY): Payer: BC Managed Care – PPO | Attending: Cardiology

## 2021-05-07 DIAGNOSIS — R002 Palpitations: Secondary | ICD-10-CM | POA: Insufficient documentation

## 2021-05-07 DIAGNOSIS — I371 Nonrheumatic pulmonary valve insufficiency: Secondary | ICD-10-CM

## 2021-05-07 LAB — ECHOCARDIOGRAM COMPLETE
Area-P 1/2: 4.06 cm2
S' Lateral: 2.6 cm

## 2021-07-19 ENCOUNTER — Ambulatory Visit (INDEPENDENT_AMBULATORY_CARE_PROVIDER_SITE_OTHER): Payer: BC Managed Care – PPO | Admitting: Cardiology

## 2021-07-19 ENCOUNTER — Encounter: Payer: Self-pay | Admitting: Cardiology

## 2021-07-19 VITALS — BP 120/82 | HR 71 | Ht 61.0 in | Wt 130.7 lb

## 2021-07-19 DIAGNOSIS — Z131 Encounter for screening for diabetes mellitus: Secondary | ICD-10-CM

## 2021-07-19 DIAGNOSIS — O1493 Unspecified pre-eclampsia, third trimester: Secondary | ICD-10-CM

## 2021-07-19 DIAGNOSIS — O1424 HELLP syndrome, complicating childbirth: Secondary | ICD-10-CM

## 2021-07-19 DIAGNOSIS — O131 Gestational [pregnancy-induced] hypertension without significant proteinuria, first trimester: Secondary | ICD-10-CM

## 2021-07-19 NOTE — Progress Notes (Unsigned)
Cardio-Obstetrics Clinic  Follow Up Note   Date:  07/19/2021   ID:  [address] Kendra Sparks, DOB [birth year], MRN [id number]  PCP:  Deeann Saint, MD   [facility] Providers Cardiologist:  None  Electrophysiologist:  None   { Click to update primary MD,subspecialty MD or APP then REFRESH:1}     Referring MD: Deeann Saint, MD   Chief Complaint: " I am doing well"   History of Present Illness:    Kendra Sparks is a 41 y.o. female [G3P3003] who returns for follow up of Postpartum hypertension and preeclampsia.   She has a medical history of gestational diabetes, hypertension, HELLP syndrome here today for follow-up visit.  She was first seen by Dr. Shari Prows on April 19, 2021 at that time they discussed the need for aggressive cardiovascular preventative measures given her increased risk.  Suggestion for modification of controlling blood pressure as well as monitoring hemoglobin A1c and cholesterol.  In terms of her frequent PACs an echocardiogram was ordered.  In the interim she did get her echocardiogram which was reported to be normal with no shock or abnormalities.  She is here today for follow-up visit.  She also says she saw Dr. Shari Prows she has had increasing headache with the nifedipine this medication has been stopped.  She has been monitoring her blood pressure is it has not been higher than 130/85 millimeters mercury with mean CT PE  Prior CV Studies Reviewed: The following studies were reviewed today:  TTE 05/07/2021 IMPRESSIONS  1. Left ventricular ejection fraction, by estimation, is 60 to 65%. Left ventricular ejection fraction by 3D volume is 60 %. The left ventricle has normal function. The left ventricle has no regional wall motion abnormalities. Left ventricular diastolic   parameters were normal. The average left ventricular global longitudinal strain is -23.4 %.   2. Right ventricular systolic function is normal. The right  ventricular size is normal.   3. The mitral valve is normal in structure. No evidence of mitral valve regurgitation. No evidence of mitral stenosis.   4. The aortic valve is tricuspid. Aortic valve regurgitation is not visualized. No aortic stenosis is present.   5. The inferior vena cava is normal in size with greater than 50% respiratory variability, suggesting right atrial pressure of 3 mmHg.   FINDINGS   Left Ventricle: Left ventricular ejection fraction, by estimation, is 60 to 65%. Left ventricular ejection fraction by 3D volume is 60 %. The left ventricle has normal function. The left ventricle has no regional wall motion abnormalities. The average  left ventricular global longitudinal strain is -23.4 %. The left ventricular internal cavity size was normal in size. There is no left ventricular hypertrophy. Left ventricular diastolic parameters were  normal.   Right Ventricle: The right ventricular size is normal. No increase in  right ventricular wall thickness. Right ventricular systolic function is  normal.   Left Atrium: Left atrial size was normal in size.   Right Atrium: Right atrial size was normal in size.   Pericardium: There is no evidence of pericardial effusion.   Mitral Valve: The mitral valve is normal in structure. No evidence of  mitral valve regurgitation. No evidence of mitral valve stenosis.   Tricuspid Valve: The tricuspid valve is normal in structure. Tricuspid  valve regurgitation is trivial.   Aortic Valve: The aortic valve is tricuspid. Aortic valve regurgitation is  not visualized. No aortic stenosis is present.   Pulmonic Valve: The pulmonic valve was  not well visualized. Pulmonic valve regurgitation is mild.   Aorta: The aortic root and ascending aorta are structurally normal, with no evidence of dilitation.   Venous: The inferior vena cava is normal in size with greater than 50% respiratory variability, suggesting right atrial pressure of 3 mmHg.    IAS/Shunts: The interatrial septum was not well visualized.       Past Medical History:  Diagnosis Date   Anal fissure    Gestational diabetes    Hx of condyloma acuminatum    Hx of varicella    Pregnancy induced hypertension    Vaginal Pap smear, abnormal    Vitamin D deficiency     Past Surgical History:  Procedure Laterality Date   NO PAST SURGERIES     { Click here to update PMH, PSH, OB Hx then refresh note  :1}   OB History     Gravida  3   Para  3   Term  3   Preterm      AB      Living  3      SAB      IAB      Ectopic      Multiple  0   Live Births  3           { Click here to update OB Charting then refresh note  :1}    Current Medications: Current Meds  Medication Sig   Prenatal Vit-Fe Fumarate-FA (PRENATAL VITAMIN PO) Take by mouth.     Allergies:   Patient has no known allergies.   Social History   Socioeconomic History   Marital status: Married    Spouse name: Not on file   Number of children: Not on file   Years of education: Not on file   Highest education level: Not on file  Occupational History   Not on file  Tobacco Use   Smoking status: Never   Smokeless tobacco: Never  Vaping Use   Vaping Use: Never used  Substance and Sexual Activity   Alcohol use: Not Currently    Comment: OCASS   Drug use: No   Sexual activity: Yes  Other Topics Concern   Not on file  Social History Narrative   Not on file   Social Determinants of Health   Financial Resource Strain: Not on file  Food Insecurity: Not on file  Transportation Needs: Not on file  Physical Activity: Not on file  Stress: Not on file  Social Connections: Not on file  { Click here to update SDOH then refresh :1}    Family History  Problem Relation Age of Onset   Hypertension Mother    Cancer Mother        breast   Diabetes Maternal Grandmother    Hypertension Maternal Grandmother    Heart attack Maternal Grandfather    { Click here to  update FH then refresh note    :1}   ROS:   Please see the history of present illness.    *** All other systems reviewed and are negative.   Labs/EKG Reviewed:    EKG:   EKG is *** ordered today.  The ekg ordered today demonstrates ***  Recent Labs: 03/26/2021: ALT 11; BUN 12; Creatinine, Ser 0.82; Magnesium 4.9; Potassium 4.1; Sodium 138 03/27/2021: Hemoglobin 12.6; Platelets 115   Recent Lipid Panel Lab Results  Component Value Date/Time   CHOL 177 10/17/2016 09:14 AM   TRIG 38.0 10/17/2016 09:14 AM  HDL 75.30 10/17/2016 09:14 AM   CHOLHDL 2 10/17/2016 09:14 AM   LDLCALC 94 10/17/2016 09:14 AM    Physical Exam:    VS:  BP 120/82   Pulse 71   Ht 5\' 1"  (1.549 m)   Wt 130 lb 11.2 oz (59.3 kg)   SpO2 98%   BMI 24.70 kg/m     Wt Readings from Last 3 Encounters:  07/19/21 130 lb 11.2 oz (59.3 kg)  04/19/21 135 lb (61.2 kg)  03/26/21 159 lb 4.8 oz (72.3 kg)     GEN: *** Well nourished, well developed in no acute distress HEENT: Normal NECK: No JVD; No carotid bruits LYMPHATICS: No lymphadenopathy CARDIAC: ***RRR, no murmurs, rubs, gallops RESPIRATORY:  Clear to auscultation without rales, wheezing or rhonchi  ABDOMEN: Soft, non-tender, non-distended MUSCULOSKELETAL:  No edema; No deformity  SKIN: Warm and dry NEUROLOGIC:  Alert and oriented x 3 PSYCHIATRIC:  Normal affect    Risk Assessment/Risk Calculators:   { Click to calculate CARPREG II - THEN refresh note :1}    { Click to caclulate Mod WHO Class of CV Risk - THEN refresh note :1}     { Click for CHADS2VASc Score - THEN Refresh Note    :03/28/21      ASSESSMENT & PLAN:     There are no Patient Instructions on file for this visit.   Dispo:  No follow-ups on file.   Medication Adjustments/Labs and Tests Ordered: Current medicines are reviewed at length with the patient today.  Concerns regarding medicines are outlined above.  Tests Ordered: No orders of the defined types were placed in  this encounter.  Medication Changes: No orders of the defined types were placed in this encounter.

## 2021-07-19 NOTE — Patient Instructions (Signed)
Medication Instructions:  Your physician recommends that you continue on your current medications as directed. Please refer to the Current Medication list given to you today.  *If you need a refill on your cardiac medications before your next appointment, please call your pharmacy*   Lab Work: A1C If you have labs (blood work) drawn today and your tests are completely normal, you will receive your results only by: MyChart Message (if you have MyChart) OR A paper copy in the mail If you have any lab test that is abnormal or we need to change your treatment, we will call you to review the results.   Testing/Procedures: NONE   Follow-Up: At [facility], you and your health needs are our priority.  As part of our continuing mission to provide you with exceptional heart care, we have created designated Provider Care Teams.  These Care Teams include your primary Cardiologist (physician) and Advanced Practice Providers (APPs -  Physician Assistants and Nurse Practitioners) who all work together to provide you with the care you need, when you need it.  Your next appointment:   6 month(s)  The format for your next appointment:   In Person  Provider:   Dr. Servando Salina Or Dr. Shari Prows   [address]  [facility]

## 2021-08-01 ENCOUNTER — Telehealth: Payer: Self-pay | Admitting: Cardiology

## 2021-08-01 NOTE — Telephone Encounter (Signed)
Spoke to patient she stated she had a AIC drawn here at [facility] on 07/19/21.Advised I see order in chart, no results.I spoke to lab tech [facility] never drawn.Advised she can have done fasting.Lab opens 8:00 am to 12:00 noon.No appointment needed.I will send message to Dr.Tobb's RN to make her aware.

## 2021-08-01 NOTE — Telephone Encounter (Signed)
Patient is calling for A1C, she states she had a it drawn on 07/19/21 when [facility].

## 2021-08-02 NOTE — Telephone Encounter (Signed)
Spoke to pt, Lab was not processed on 7/7 over at [facility] at the [facility].  Pt is going to come to [facility] next week to have lab redrawn.

## 2021-08-08 ENCOUNTER — Telehealth: Payer: Self-pay

## 2021-08-08 NOTE — Telephone Encounter (Signed)
Called pt to let her know Dr. Servando Salina did receive her A1c results and they were normal. No answer, left detailed message on her machine.

## 2021-11-13 ENCOUNTER — Encounter: Payer: Self-pay | Admitting: Internal Medicine

## 2021-11-13 ENCOUNTER — Ambulatory Visit: Payer: BC Managed Care – PPO | Admitting: Internal Medicine

## 2021-11-13 VITALS — BP 120/80 | HR 76 | Temp 98.3°F | Ht 61.0 in | Wt 129.8 lb

## 2021-11-13 DIAGNOSIS — O1495 Unspecified pre-eclampsia, complicating the puerperium: Secondary | ICD-10-CM | POA: Diagnosis not present

## 2021-11-13 LAB — CBC WITH DIFFERENTIAL/PLATELET
Basophils Absolute: 0 10*3/uL (ref 0.0–0.1)
Basophils Relative: 0.7 % (ref 0.0–3.0)
Eosinophils Absolute: 0.1 10*3/uL (ref 0.0–0.7)
Eosinophils Relative: 0.8 % (ref 0.0–5.0)
HCT: 40.3 % (ref 36.0–46.0)
Hemoglobin: 13.4 g/dL (ref 12.0–15.0)
Lymphocytes Relative: 21.1 % (ref 12.0–46.0)
Lymphs Abs: 1.4 10*3/uL (ref 0.7–4.0)
MCHC: 33.3 g/dL (ref 30.0–36.0)
MCV: 95.6 fl (ref 78.0–100.0)
Monocytes Absolute: 0.5 10*3/uL (ref 0.1–1.0)
Monocytes Relative: 7.7 % (ref 3.0–12.0)
Neutro Abs: 4.7 10*3/uL (ref 1.4–7.7)
Neutrophils Relative %: 69.7 % (ref 43.0–77.0)
Platelets: 244 10*3/uL (ref 150.0–400.0)
RBC: 4.22 Mil/uL (ref 3.87–5.11)
RDW: 12.5 % (ref 11.5–15.5)
WBC: 6.7 10*3/uL (ref 4.0–10.5)

## 2021-11-13 LAB — COMPREHENSIVE METABOLIC PANEL
ALT: 11 U/L (ref 0–35)
AST: 14 U/L (ref 0–37)
Albumin: 4.3 g/dL (ref 3.5–5.2)
Alkaline Phosphatase: 107 U/L (ref 39–117)
BUN: 19 mg/dL (ref 6–23)
CO2: 29 mEq/L (ref 19–32)
Calcium: 9.3 mg/dL (ref 8.4–10.5)
Chloride: 104 mEq/L (ref 96–112)
Creatinine, Ser: 0.68 mg/dL (ref 0.40–1.20)
GFR: 110.13 mL/min (ref >60.00)
Glucose, Bld: 90 mg/dL (ref 70–99)
Potassium: 3.7 mEq/L (ref 3.5–5.1)
Sodium: 141 mEq/L (ref 135–145)
Total Bilirubin: 1 mg/dL (ref 0.2–1.2)
Total Protein: 7.5 g/dL (ref 6.0–8.3)

## 2021-11-13 LAB — LIPID PANEL
Cholesterol: 177 mg/dL (ref 0–200)
HDL: 77.5 mg/dL (ref >39.00)
LDL Cholesterol: 91 mg/dL (ref 0–99)
NonHDL: 99.82
Total CHOL/HDL Ratio: 2
Triglycerides: 43 mg/dL (ref 0.0–149.0)
VLDL: 8.6 mg/dL (ref 0.0–40.0)

## 2021-11-13 LAB — HEMOGLOBIN A1C: Hgb A1c MFr Bld: 5.6 % (ref 4.6–6.5)

## 2021-11-13 NOTE — Progress Notes (Signed)
Established Patient Office Visit     CC/Reason for Visit: Establish care, discuss chronic medical conditions  HPI: Kendra Sparks is a 41 y.o. female who is coming in today for the above mentioned reasons. Past Medical History is significant for: Preeclampsia during postpartum period after delivery in March 2023.  She also has a prior history of HELLP syndrome during pregnancy.  She has 3 children.  She does not smoke, she drinks alcohol occasionally, she has no known drug allergies, no past surgical history.  Family history significant for mother with breast cancer, mom and maternal grandmother have diabetes and hypertension.  She recently updated her flu and COVID vaccines.  Tdap is up-to-date.  Her GYN is Dr. Garwin Brothers.   Past Medical/Surgical History: Past Medical History:  Diagnosis Date   Anal fissure    Gestational diabetes    Hx of condyloma acuminatum    Hx of varicella    Pregnancy induced hypertension    Vaginal Pap smear, abnormal    Vitamin D deficiency     Past Surgical History:  Procedure Laterality Date   NO PAST SURGERIES      Social History:  reports that she has never smoked. She has never used smokeless tobacco. She reports that she does not currently use alcohol. She reports that she does not use drugs.  Allergies: No Known Allergies  Family History:  Family History  Problem Relation Age of Onset   Hypertension Mother    Cancer Mother        breast   Diabetes Maternal Grandmother    Hypertension Maternal Grandmother    Heart attack Maternal Grandfather      Current Outpatient Medications:    Prenatal Vit-Fe Fumarate-FA (PRENATAL VITAMIN PO), Take by mouth., Disp: , Rfl:   Review of Systems:  Constitutional: Denies fever, chills, diaphoresis, appetite change and fatigue.  HEENT: Denies photophobia, eye pain, redness, hearing loss, ear pain, congestion, sore throat, rhinorrhea, sneezing, mouth sores, trouble swallowing, neck  pain, neck stiffness and tinnitus.   Respiratory: Denies SOB, DOE, cough, chest tightness,  and wheezing.   Cardiovascular: Denies chest pain, palpitations and leg swelling.  Gastrointestinal: Denies nausea, vomiting, abdominal pain, diarrhea, constipation, blood in stool and abdominal distention.  Genitourinary: Denies dysuria, urgency, frequency, hematuria, flank pain and difficulty urinating.  Endocrine: Denies: hot or cold intolerance, sweats, changes in hair or nails, polyuria, polydipsia. Musculoskeletal: Denies myalgias, back pain, joint swelling, arthralgias and gait problem.  Skin: Denies pallor, rash and wound.  Neurological: Denies dizziness, seizures, syncope, weakness, light-headedness, numbness and headaches.  Hematological: Denies adenopathy. Easy bruising, personal or family bleeding history  Psychiatric/Behavioral: Denies suicidal ideation, mood changes, confusion, nervousness, sleep disturbance and agitation    Physical Exam: Vitals:   11/13/21 0740  BP: 120/80  Pulse: 76  Temp: 98.3 F (36.8 C)  TempSrc: Oral  SpO2: 99%  Weight: 129 lb 12.8 oz (58.9 kg)  Height: 5\' 1"  (1.549 m)    Body mass index is 24.53 kg/m.   Constitutional: NAD, calm, comfortable Eyes: PERRL, lids and conjunctivae normal ENMT: Mucous membranes are moist. Respiratory: clear to auscultation bilaterally, no wheezing, no crackles. Normal respiratory effort. No accessory muscle use.  Cardiovascular: Regular rate and rhythm, no murmurs / rubs / gallops. No extremity edema.  Psychiatric: Normal judgment and insight. Alert and oriented x 3. Normal mood.    Impression and Plan:  Pre-eclampsia in postpartum period - Plan: CBC with Differential/Platelet, Comprehensive metabolic panel, Hemoglobin A1c,  Lipid panel  -Blood pressure is currently well controlled not on any medication. -Preeclampsia does put her at a higher cardiovascular risk, she does have family history of diabetes and  hypertension.  Check labs today.  Continue ambulatory blood pressure measurements.  Time spent:31 minutes reviewing chart, interviewing and examining patient and formulating plan of care.      Lelon Frohlich, MD [facility] at [facility]

## 2021-11-26 ENCOUNTER — Encounter: Payer: Self-pay | Admitting: Internal Medicine

## 2022-01-03 ENCOUNTER — Ambulatory Visit: Payer: BC Managed Care – PPO | Admitting: Cardiology

## 2022-10-20 ENCOUNTER — Ambulatory Visit: Payer: BC Managed Care – PPO | Admitting: Internal Medicine

## 2022-10-20 ENCOUNTER — Encounter: Payer: Self-pay | Admitting: Internal Medicine

## 2022-10-20 VITALS — BP 140/85 | HR 62 | Temp 98.2°F | Ht 61.0 in | Wt 137.0 lb

## 2022-10-20 DIAGNOSIS — I10 Essential (primary) hypertension: Secondary | ICD-10-CM | POA: Insufficient documentation

## 2022-10-20 MED ORDER — AMLODIPINE BESYLATE 5 MG PO TABS: 5.0000 mg | ORAL_TABLET | Freq: Every day | ORAL | 1 refills | Status: DC

## 2022-10-20 NOTE — Progress Notes (Signed)
Established Patient Office Visit     CC/Reason for Visit: Elevated blood pressure  HPI: Kendra Sparks is a 42 y.o. female who is coming in today for the above mentioned reasons. Past Medical History is significant for: Preeclampsia during pregnancy and strong family history of hypertension.  Her blood pressure resolved after delivery.  However over the last couple months she has started to have some low-grade headaches and started rechecking blood pressure.  She has noted systolics in the high 130s to 140s and diastolics in the high 80s to 90s.   Past Medical/Surgical History: Past Medical History:  Diagnosis Date   Anal fissure    Gestational diabetes    Hx of condyloma acuminatum    Hx of varicella    Pregnancy induced hypertension    Vaginal Pap smear, abnormal    Vitamin D deficiency     Past Surgical History:  Procedure Laterality Date   NO PAST SURGERIES      Social History:  reports that she has never smoked. She has never used smokeless tobacco. She reports that she does not currently use alcohol. She reports that she does not use drugs.  Allergies: No Known Allergies  Family History:  Family History  Problem Relation Age of Onset   Hypertension Mother    Cancer Mother        breast   Diabetes Maternal Grandmother    Hypertension Maternal Grandmother    Heart attack Maternal Grandfather      Current Outpatient Medications:    amLODipine (NORVASC) 5 MG tablet, Take 1 tablet (5 mg total) by mouth daily., Disp: 90 tablet, Rfl: 1  Review of Systems:  Negative unless indicated in HPI.   Physical Exam: Vitals:   10/20/22 1110  BP: (!) 140/85  Pulse: 62  Temp: 98.2 F (36.8 C)  TempSrc: Oral  SpO2: 99%  Weight: 137 lb (62.1 kg)  Height: 5\' 1"  (1.549 m)    Body mass index is 25.89 kg/m.   Physical Exam Vitals reviewed.  Constitutional:      Appearance: Normal appearance.  HENT:     Head: Normocephalic and atraumatic.   Eyes:     Conjunctiva/sclera: Conjunctivae normal.     Pupils: Pupils are equal, round, and reactive to light.  Cardiovascular:     Rate and Rhythm: Normal rate and regular rhythm.  Pulmonary:     Effort: Pulmonary effort is normal.     Breath sounds: Normal breath sounds.  Skin:    General: Skin is warm and dry.  Neurological:     General: No focal deficit present.     Mental Status: She is alert and oriented to person, place, and time.  Psychiatric:        Mood and Affect: Mood normal.        Behavior: Behavior normal.        Thought Content: Thought content normal.        Judgment: Judgment normal.      Impression and Plan:  Primary hypertension -     amLODIPine Besylate; Take 1 tablet (5 mg total) by mouth daily.  Dispense: 90 tablet; Refill: 1   -Given in office and ambulatory blood pressure measurements, I will make diagnosis of hypertension today.  Start amlodipine 5 mg daily and return in 6 weeks for follow-up.  Time spent:30 minutes reviewing chart, interviewing and examining patient and formulating plan of care.     Chaya Jan, MD [facility]

## 2022-12-02 ENCOUNTER — Encounter: Payer: Self-pay | Admitting: Internal Medicine

## 2022-12-02 ENCOUNTER — Ambulatory Visit (INDEPENDENT_AMBULATORY_CARE_PROVIDER_SITE_OTHER): Payer: BC Managed Care – PPO | Admitting: Internal Medicine

## 2022-12-02 VITALS — BP 110/80 | HR 75 | Temp 98.2°F | Ht 61.5 in | Wt 134.3 lb

## 2022-12-02 DIAGNOSIS — I10 Essential (primary) hypertension: Secondary | ICD-10-CM

## 2022-12-02 DIAGNOSIS — Z1231 Encounter for screening mammogram for malignant neoplasm of breast: Secondary | ICD-10-CM

## 2022-12-02 DIAGNOSIS — Z0000 Encounter for general adult medical examination without abnormal findings: Secondary | ICD-10-CM

## 2022-12-02 LAB — CBC WITH DIFFERENTIAL/PLATELET
Basophils Absolute: 0 10*3/uL (ref 0.0–0.1)
Basophils Relative: 0.6 % (ref 0.0–3.0)
Eosinophils Absolute: 0.1 10*3/uL (ref 0.0–0.7)
Eosinophils Relative: 1 % (ref 0.0–5.0)
HCT: 40.6 % (ref 36.0–46.0)
Hemoglobin: 13.3 g/dL (ref 12.0–15.0)
Lymphocytes Relative: 20.9 % (ref 12.0–46.0)
Lymphs Abs: 1.5 10*3/uL (ref 0.7–4.0)
MCHC: 32.8 g/dL (ref 30.0–36.0)
MCV: 96.6 fL (ref 78.0–100.0)
Monocytes Absolute: 0.6 10*3/uL (ref 0.1–1.0)
Monocytes Relative: 8.9 % (ref 3.0–12.0)
Neutro Abs: 4.8 10*3/uL (ref 1.4–7.7)
Neutrophils Relative %: 68.6 % (ref 43.0–77.0)
Platelets: 249 10*3/uL (ref 150.0–400.0)
RBC: 4.2 Mil/uL (ref 3.87–5.11)
RDW: 12.7 % (ref 11.5–15.5)
WBC: 7 10*3/uL (ref 4.0–10.5)

## 2022-12-02 LAB — COMPREHENSIVE METABOLIC PANEL
ALT: 11 U/L (ref 0–35)
AST: 14 U/L (ref 0–37)
Albumin: 4.3 g/dL (ref 3.5–5.2)
Alkaline Phosphatase: 78 U/L (ref 39–117)
BUN: 14 mg/dL (ref 6–23)
CO2: 28 meq/L (ref 19–32)
Calcium: 9 mg/dL (ref 8.4–10.5)
Chloride: 102 meq/L (ref 96–112)
Creatinine, Ser: 0.55 mg/dL (ref 0.40–1.20)
GFR: 115.05 mL/min (ref >60.00)
Glucose, Bld: 78 mg/dL (ref 70–99)
Potassium: 3.8 meq/L (ref 3.5–5.1)
Sodium: 139 meq/L (ref 135–145)
Total Bilirubin: 1.2 mg/dL (ref 0.2–1.2)
Total Protein: 7 g/dL (ref 6.0–8.3)

## 2022-12-02 LAB — VITAMIN D 25 HYDROXY (VIT D DEFICIENCY, FRACTURES): VITD: 15.3 ng/mL — ABNORMAL LOW (ref 30.00–100.00)

## 2022-12-02 LAB — LIPID PANEL
Cholesterol: 175 mg/dL (ref 0–200)
HDL: 74 mg/dL (ref >39.00)
LDL Cholesterol: 90 mg/dL (ref 0–99)
NonHDL: 101.3
Total CHOL/HDL Ratio: 2
Triglycerides: 59 mg/dL (ref 0.0–149.0)
VLDL: 11.8 mg/dL (ref 0.0–40.0)

## 2022-12-02 LAB — TSH: TSH: 2.19 u[IU]/mL (ref 0.35–5.50)

## 2022-12-02 LAB — VITAMIN B12: Vitamin B-12: 947 pg/mL — ABNORMAL HIGH (ref 211–911)

## 2022-12-02 NOTE — Progress Notes (Signed)
Established Patient Office Visit     CC/Reason for Visit: Annual preventive exam  HPI: Kendra Sparks is a 42 y.o. female who is coming in today for the above mentioned reasons. Past Medical History is significant for: Recently diagnosed hypertension and is tolerating amlodipine 5 mg daily quite well.  She is overdue for an eye exam, has routine dental care.  Is due for COVID-vaccine.  Will be due for for screening mammogram in December.  Pap smear is up-to-date.   Past Medical/Surgical History: Past Medical History:  Diagnosis Date   Anal fissure    Gestational diabetes    Hx of condyloma acuminatum    Hx of varicella    Pregnancy induced hypertension    Vaginal Pap smear, abnormal    Vitamin D deficiency     Past Surgical History:  Procedure Laterality Date   NO PAST SURGERIES      Social History:  reports that she has never smoked. She has never used smokeless tobacco. She reports that she does not currently use alcohol. She reports that she does not use drugs.  Allergies: No Known Allergies  Family History:  Family History  Problem Relation Age of Onset   Hypertension Mother    Cancer Mother        breast   Diabetes Maternal Grandmother    Hypertension Maternal Grandmother    Heart attack Maternal Grandfather      Current Outpatient Medications:    amLODipine (NORVASC) 5 MG tablet, Take 1 tablet (5 mg total) by mouth daily., Disp: 90 tablet, Rfl: 1  Review of Systems:  Negative unless indicated in HPI.   Physical Exam: Vitals:   12/02/22 0737  BP: 110/80  Pulse: 75  Temp: 98.2 F (36.8 C)  TempSrc: Oral  SpO2: 99%  Weight: 134 lb 4.8 oz (60.9 kg)  Height: 5' 1.5" (1.562 m)    Body mass index is 24.96 kg/m.   Physical Exam Vitals reviewed.  Constitutional:      General: She is not in acute distress.    Appearance: Normal appearance. She is not ill-appearing, toxic-appearing or diaphoretic.  HENT:     Head:  Normocephalic.     Right Ear: Tympanic membrane, ear canal and external ear normal. There is no impacted cerumen.     Left Ear: Tympanic membrane, ear canal and external ear normal. There is no impacted cerumen.     Nose: Nose normal.     Mouth/Throat:     Mouth: Mucous membranes are moist.     Pharynx: Oropharynx is clear. No oropharyngeal exudate or posterior oropharyngeal erythema.  Eyes:     General: No scleral icterus.       Right eye: No discharge.        Left eye: No discharge.     Conjunctiva/sclera: Conjunctivae normal.     Pupils: Pupils are equal, round, and reactive to light.  Neck:     Vascular: No carotid bruit.  Cardiovascular:     Rate and Rhythm: Normal rate and regular rhythm.     Pulses: Normal pulses.     Heart sounds: Normal heart sounds.  Pulmonary:     Effort: Pulmonary effort is normal. No respiratory distress.     Breath sounds: Normal breath sounds.  Abdominal:     General: Abdomen is flat. Bowel sounds are normal.     Palpations: Abdomen is soft.  Musculoskeletal:        General: Normal range of  motion.     Cervical back: Normal range of motion.  Skin:    General: Skin is warm and dry.  Neurological:     General: No focal deficit present.     Mental Status: She is alert and oriented to person, place, and time. Mental status is at baseline.  Psychiatric:        Mood and Affect: Mood normal.        Behavior: Behavior normal.        Thought Content: Thought content normal.        Judgment: Judgment normal.     Flowsheet Row Office Visit from 12/02/2022 in [facility] [place]  PHQ-9 Total Score 3        Impression and Plan:  Encounter for preventive health examination  Primary hypertension -     CBC with Differential/Platelet; Future -     Comprehensive metabolic panel; Future -     Lipid panel; Future -     TSH; Future -     Vitamin B12; Future -     VITAMIN D 25 Hydroxy (Vit-D Deficiency, Fractures);  Future  Encounter for screening mammogram for malignant neoplasm of breast -     Digital Screening Mammogram, Left and Right; Future   -Recommend routine eye and dental care. -Healthy lifestyle discussed in detail. -Labs to be updated today. -Prostate cancer screening: Not applicable Health Maintenance  Topic Date Due   COVID-19 Vaccine (6 - 2023-24 season) 09/14/2022   Pap with HPV screening  09/15/2023   DTaP/Tdap/Td vaccine (2 - Td or Tdap) 11/14/2030   Flu Shot  Completed   Hepatitis C Screening  Completed   HIV Screening  Completed   HPV Vaccine  Aged Out     -Advised to update COVID-vaccine and eye exam, screening mammogram ordered.    Chaya Jan, MD [place] at [facility]

## 2022-12-09 ENCOUNTER — Other Ambulatory Visit: Payer: Self-pay | Admitting: Internal Medicine

## 2022-12-09 ENCOUNTER — Telehealth: Payer: Self-pay | Admitting: Internal Medicine

## 2022-12-09 ENCOUNTER — Encounter: Payer: Self-pay | Admitting: Internal Medicine

## 2022-12-09 DIAGNOSIS — E559 Vitamin D deficiency, unspecified: Secondary | ICD-10-CM

## 2022-12-09 MED ORDER — VITAMIN D (ERGOCALCIFEROL) 1.25 MG (50000 UNIT) PO CAPS: 50000.0000 [IU] | ORAL_CAPSULE | ORAL | 0 refills | Status: AC

## 2022-12-09 NOTE — Telephone Encounter (Signed)
Viewed labs with patient.  Patient is requesting an A1c at her next visit.

## 2022-12-09 NOTE — Telephone Encounter (Signed)
Pt called, requesting a call back to go over her lab results.  CMA was with a Pt.  Pt asked that CMA call back at her earliest convenience.

## 2022-12-15 ENCOUNTER — Other Ambulatory Visit: Payer: Self-pay | Admitting: *Deleted

## 2022-12-15 DIAGNOSIS — E559 Vitamin D deficiency, unspecified: Secondary | ICD-10-CM

## 2022-12-31 ENCOUNTER — Ambulatory Visit
Admission: RE | Admit: 2022-12-31 | Discharge: 2022-12-31 | Disposition: A | Discharge status: Home / Self Care | Payer: BC Managed Care – PPO | Source: Ambulatory Visit | Attending: Internal Medicine | Admitting: Internal Medicine

## 2022-12-31 DIAGNOSIS — Z1231 Encounter for screening mammogram for malignant neoplasm of breast: Secondary | ICD-10-CM

## 2023-01-05 ENCOUNTER — Other Ambulatory Visit: Payer: Self-pay | Admitting: Internal Medicine

## 2023-01-05 DIAGNOSIS — R928 Other abnormal and inconclusive findings on diagnostic imaging of breast: Secondary | ICD-10-CM

## 2023-01-20 ENCOUNTER — Ambulatory Visit
Admission: RE | Admit: 2023-01-20 | Discharge: 2023-01-20 | Disposition: A | Discharge status: Home / Self Care | Payer: Self-pay | Source: Ambulatory Visit | Attending: Internal Medicine | Admitting: Internal Medicine

## 2023-01-20 ENCOUNTER — Other Ambulatory Visit: Payer: BC Managed Care – PPO

## 2023-01-20 ENCOUNTER — Ambulatory Visit
Admission: RE | Admit: 2023-01-20 | Discharge: 2023-01-20 | Disposition: A | Discharge status: Home / Self Care | Payer: BC Managed Care – PPO | Source: Ambulatory Visit | Attending: Internal Medicine | Admitting: Internal Medicine

## 2023-01-20 DIAGNOSIS — R928 Other abnormal and inconclusive findings on diagnostic imaging of breast: Secondary | ICD-10-CM

## 2023-01-27 ENCOUNTER — Other Ambulatory Visit: Payer: BC Managed Care – PPO

## 2023-02-23 ENCOUNTER — Other Ambulatory Visit: Payer: Self-pay | Admitting: Internal Medicine

## 2023-02-23 DIAGNOSIS — E559 Vitamin D deficiency, unspecified: Secondary | ICD-10-CM

## 2023-04-19 ENCOUNTER — Other Ambulatory Visit: Payer: Self-pay | Admitting: Internal Medicine

## 2023-04-19 DIAGNOSIS — I10 Essential (primary) hypertension: Secondary | ICD-10-CM

## 2023-11-19 ENCOUNTER — Other Ambulatory Visit: Payer: Self-pay | Admitting: Obstetrics and Gynecology

## 2023-11-19 DIAGNOSIS — Z1231 Encounter for screening mammogram for malignant neoplasm of breast: Secondary | ICD-10-CM

## 2023-12-03 ENCOUNTER — Ambulatory Visit: Payer: BC Managed Care – PPO | Admitting: Internal Medicine

## 2023-12-03 ENCOUNTER — Ambulatory Visit: Payer: Self-pay | Admitting: Internal Medicine

## 2023-12-03 ENCOUNTER — Encounter: Payer: Self-pay | Admitting: Internal Medicine

## 2023-12-03 VITALS — BP 110/80 | HR 67 | Temp 98.8°F | Ht 61.0 in | Wt 133.3 lb

## 2023-12-03 DIAGNOSIS — O24410 Gestational diabetes mellitus in pregnancy, diet controlled: Secondary | ICD-10-CM

## 2023-12-03 DIAGNOSIS — Z0000 Encounter for general adult medical examination without abnormal findings: Secondary | ICD-10-CM

## 2023-12-03 DIAGNOSIS — Z131 Encounter for screening for diabetes mellitus: Secondary | ICD-10-CM

## 2023-12-03 DIAGNOSIS — I10 Essential (primary) hypertension: Secondary | ICD-10-CM | POA: Diagnosis not present

## 2023-12-03 DIAGNOSIS — E559 Vitamin D deficiency, unspecified: Secondary | ICD-10-CM

## 2023-12-03 DIAGNOSIS — Z8632 Personal history of gestational diabetes: Secondary | ICD-10-CM | POA: Diagnosis not present

## 2023-12-03 LAB — CBC WITH DIFFERENTIAL/PLATELET
Basophils Absolute: 0 K/uL (ref 0.0–0.1)
Basophils Relative: 0.8 % (ref 0.0–3.0)
Eosinophils Absolute: 0.1 K/uL (ref 0.0–0.7)
Eosinophils Relative: 1.9 % (ref 0.0–5.0)
HCT: 41 % (ref 36.0–46.0)
Hemoglobin: 13.8 g/dL (ref 12.0–15.0)
Lymphocytes Relative: 29.3 % (ref 12.0–46.0)
Lymphs Abs: 1.4 K/uL (ref 0.7–4.0)
MCHC: 33.6 g/dL (ref 30.0–36.0)
MCV: 93.9 fl (ref 78.0–100.0)
Monocytes Absolute: 0.5 K/uL (ref 0.1–1.0)
Monocytes Relative: 10.8 % (ref 3.0–12.0)
Neutro Abs: 2.6 K/uL (ref 1.4–7.7)
Neutrophils Relative %: 57.2 % (ref 43.0–77.0)
Platelets: 283 K/uL (ref 150.0–400.0)
RBC: 4.37 Mil/uL (ref 3.87–5.11)
RDW: 12.4 % (ref 11.5–15.5)
WBC: 4.6 K/uL (ref 4.0–10.5)

## 2023-12-03 LAB — LIPID PANEL
Cholesterol: 166 mg/dL (ref 0–200)
HDL: 71.1 mg/dL (ref >39.00)
LDL Cholesterol: 84 mg/dL (ref 0–99)
NonHDL: 95.21
Total CHOL/HDL Ratio: 2
Triglycerides: 55 mg/dL (ref 0.0–149.0)
VLDL: 11 mg/dL (ref 0.0–40.0)

## 2023-12-03 LAB — COMPREHENSIVE METABOLIC PANEL WITH GFR
ALT: 12 U/L (ref 0–35)
AST: 16 U/L (ref 0–37)
Albumin: 4.3 g/dL (ref 3.5–5.2)
Alkaline Phosphatase: 69 U/L (ref 39–117)
BUN: 12 mg/dL (ref 6–23)
CO2: 26 meq/L (ref 19–32)
Calcium: 8.9 mg/dL (ref 8.4–10.5)
Chloride: 105 meq/L (ref 96–112)
Creatinine, Ser: 0.68 mg/dL (ref 0.40–1.20)
GFR: 108.55 mL/min (ref >60.00)
Glucose, Bld: 94 mg/dL (ref 70–99)
Potassium: 4.2 meq/L (ref 3.5–5.1)
Sodium: 138 meq/L (ref 135–145)
Total Bilirubin: 1 mg/dL (ref 0.2–1.2)
Total Protein: 7.4 g/dL (ref 6.0–8.3)

## 2023-12-03 LAB — VITAMIN B12: Vitamin B-12: 844 pg/mL (ref 211–911)

## 2023-12-03 LAB — TSH: TSH: 1.74 u[IU]/mL (ref 0.35–5.50)

## 2023-12-03 LAB — HEMOGLOBIN A1C: Hgb A1c MFr Bld: 5.3 % (ref 4.6–6.5)

## 2023-12-03 LAB — VITAMIN D 25 HYDROXY (VIT D DEFICIENCY, FRACTURES): VITD: 23.31 ng/mL — ABNORMAL LOW (ref 30.00–100.00)

## 2023-12-03 MED ORDER — VITAMIN D (ERGOCALCIFEROL) 1.25 MG (50000 UNIT) PO CAPS: 50000.0000 [IU] | ORAL_CAPSULE | ORAL | 0 refills | Status: AC

## 2023-12-03 MED ORDER — AMLODIPINE BESYLATE 5 MG PO TABS: 5.0000 mg | ORAL_TABLET | Freq: Every day | ORAL | 1 refills | Status: AC

## 2023-12-03 NOTE — Progress Notes (Addendum)
 "    Established Patient Office Visit     CC/Reason for Visit: Annual preventive exam  HPI: Kendra Sparks is a 43 y.o. female who is coming in today for the above mentioned reasons. Past Medical History is significant for: Hypertension that has been well-controlled.  No concerns or complaints.  Has routine eye and dental care.   Past Medical/Surgical History: Past Medical History:  Diagnosis Date   Anal fissure    Gestational diabetes    Hx of condyloma acuminatum    Hx of varicella    Pregnancy induced hypertension    Vaginal Pap smear, abnormal    Vitamin D  deficiency     Past Surgical History:  Procedure Laterality Date   NO PAST SURGERIES      Social History:  reports that she has never smoked. She has never used smokeless tobacco. She reports that she does not currently use alcohol. She reports that she does not use drugs.  Allergies: No Known Allergies  Family History:  Family History  Problem Relation Age of Onset   Breast cancer Mother 65   Hypertension Mother    Diabetes Maternal Grandmother    Hypertension Maternal Grandmother    Heart attack Maternal Grandfather      Current Outpatient Medications:    amLODipine  (NORVASC ) 5 MG tablet, Take 1 tablet (5 mg total) by mouth daily., Disp: 90 tablet, Rfl: 1   Vitamin D , Ergocalciferol , (DRISDOL ) 1.25 MG (50000 UNIT) CAPS capsule, Take 1 capsule (50,000 Units total) by mouth every 7 (seven) days for 12 doses., Disp: 12 capsule, Rfl: 0  Review of Systems:  Negative unless indicated in HPI.   Physical Exam: Vitals:   12/03/23 0739  BP: 110/80  Pulse: 67  Temp: 98.8 F (37.1 C)  TempSrc: Oral  SpO2: 99%  Weight: 133 lb 4.8 oz (60.5 kg)  Height: 5' 1 (1.549 m)    Body mass index is 25.19 kg/m.   Physical Exam Vitals reviewed.  Constitutional:      General: She is not in acute distress.    Appearance: Normal appearance. She is not ill-appearing, toxic-appearing or  diaphoretic.  HENT:     Head: Normocephalic.     Right Ear: Tympanic membrane, ear canal and external ear normal. There is no impacted cerumen.     Left Ear: Tympanic membrane, ear canal and external ear normal. There is no impacted cerumen.     Nose: Nose normal.     Mouth/Throat:     Mouth: Mucous membranes are moist.     Pharynx: Oropharynx is clear. No oropharyngeal exudate or posterior oropharyngeal erythema.  Eyes:     General: No scleral icterus.       Right eye: No discharge.        Left eye: No discharge.     Conjunctiva/sclera: Conjunctivae normal.     Pupils: Pupils are equal, round, and reactive to light.  Neck:     Vascular: No carotid bruit.  Cardiovascular:     Rate and Rhythm: Normal rate and regular rhythm.     Pulses: Normal pulses.     Heart sounds: Normal heart sounds.  Pulmonary:     Effort: Pulmonary effort is normal. No respiratory distress.     Breath sounds: Normal breath sounds.  Abdominal:     General: Abdomen is flat. Bowel sounds are normal.     Palpations: Abdomen is soft.  Musculoskeletal:        General: Normal range of  motion.     Cervical back: Normal range of motion.  Skin:    General: Skin is warm and dry.  Neurological:     General: No focal deficit present.     Mental Status: She is alert and oriented to person, place, and time. Mental status is at baseline.  Psychiatric:        Mood and Affect: Mood normal.        Behavior: Behavior normal.        Thought Content: Thought content normal.        Judgment: Judgment normal.     Flowsheet Row Office Visit from 12/03/2023 in [facility] [place]  PHQ-9 Total Score 2     Impression and Plan:  Encounter for preventive health examination  Primary hypertension -     CBC with Differential/Platelet; Future -     Comprehensive metabolic panel with GFR; Future -     Lipid panel; Future -     TSH; Future -     Vitamin B12; Future -     amLODIPine  Besylate; Take  1 tablet (5 mg total) by mouth daily.  Dispense: 90 tablet; Refill: 1  Vitamin D  deficiency -     VITAMIN D  25 Hydroxy (Vit-D Deficiency, Fractures); Future  History of gestational diabetes -     Hemoglobin A1c; Future   -Recommend routine eye and dental care. -Healthy lifestyle discussed in detail. -Labs to be updated today. -Prostate cancer screening: N/A Health Maintenance  Topic Date Due   Hepatitis B Vaccine (1 of 3 - 19+ 3-dose series) Never done   HPV Vaccine (1 - 3-dose SCDM series) Never done   Pap with HPV screening  09/15/2023   Breast Cancer Screening  12/31/2025   DTaP/Tdap/Td vaccine (2 - Td or Tdap) 11/14/2030   Flu Shot  Completed   COVID-19 Vaccine  Completed   Hepatitis C Screening  Completed   HIV Screening  Completed   Pneumococcal Vaccine  Aged Out   Meningitis B Vaccine  Aged Out    - Immunizations and cancer screenings are up-to-date.      Tully Theophilus Andrews, MD [place] [facility] at [facility]

## 2024-01-01 ENCOUNTER — Inpatient Hospital Stay
Admission: RE | Admit: 2024-01-01 | Discharge: 2024-01-01 | Attending: Obstetrics and Gynecology | Admitting: Obstetrics and Gynecology

## 2024-01-01 DIAGNOSIS — Z1231 Encounter for screening mammogram for malignant neoplasm of breast: Secondary | ICD-10-CM
